# Patient Record
Sex: Female | Born: 1990 | Race: White | Hispanic: No | Marital: Single | Smoking: Never smoker
Health system: Southern US, Community
[De-identification: ages and names within clinical notes are randomized; demographics above are authoritative.]

## PROBLEM LIST (undated history)

## (undated) DIAGNOSIS — Z789 Other specified health status: Secondary | ICD-10-CM

## (undated) DIAGNOSIS — F32A Depression, unspecified: Secondary | ICD-10-CM

## (undated) DIAGNOSIS — F419 Anxiety disorder, unspecified: Secondary | ICD-10-CM

## (undated) DIAGNOSIS — F329 Major depressive disorder, single episode, unspecified: Secondary | ICD-10-CM

## (undated) HISTORY — DX: Depression, unspecified: F32.A

## (undated) HISTORY — PX: NO PAST SURGERIES: SHX2092

## (undated) HISTORY — PX: TONSILLECTOMY: SUR1361

## (undated) HISTORY — DX: Anxiety disorder, unspecified: F41.9

---
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
## 2009-06-15 ENCOUNTER — Inpatient Hospital Stay (HOSPITAL_COMMUNITY): Admission: AD | Admit: 2009-06-15 | Discharge: 2009-06-15 | Payer: Self-pay | Admitting: Obstetrics & Gynecology

## 2009-09-02 ENCOUNTER — Inpatient Hospital Stay (HOSPITAL_COMMUNITY): Admission: AD | Admit: 2009-09-02 | Discharge: 2009-09-02 | Payer: Self-pay | Admitting: Obstetrics & Gynecology

## 2009-09-03 ENCOUNTER — Inpatient Hospital Stay (HOSPITAL_COMMUNITY): Admission: AD | Admit: 2009-09-03 | Discharge: 2009-09-05 | Payer: Self-pay | Admitting: Obstetrics & Gynecology

## 2009-09-03 ENCOUNTER — Ambulatory Visit: Payer: Self-pay | Admitting: Obstetrics and Gynecology

## 2010-07-24 LAB — CBC
HCT: 38.9 % (ref 36.0–46.0)
Platelets: 195 10*3/uL (ref 150–400)
RDW: 13.5 % (ref 11.5–15.5)

## 2011-05-15 ENCOUNTER — Other Ambulatory Visit: Payer: Self-pay | Admitting: Physician Assistant

## 2011-05-15 DIAGNOSIS — IMO0002 Reserved for concepts with insufficient information to code with codable children: Secondary | ICD-10-CM

## 2011-05-20 ENCOUNTER — Ambulatory Visit (HOSPITAL_COMMUNITY): Admission: RE | Admit: 2011-05-20 | Payer: No Typology Code available for payment source | Source: Ambulatory Visit

## 2011-05-23 ENCOUNTER — Encounter (HOSPITAL_COMMUNITY): Payer: Self-pay

## 2011-05-23 ENCOUNTER — Emergency Department (HOSPITAL_COMMUNITY)
Admission: EM | Admit: 2011-05-23 | Discharge: 2011-05-24 | Disposition: A | Discharge status: Home / Self Care | Payer: No Typology Code available for payment source | Attending: Emergency Medicine | Admitting: Emergency Medicine

## 2011-05-23 DIAGNOSIS — O99891 Other specified diseases and conditions complicating pregnancy: Secondary | ICD-10-CM | POA: Insufficient documentation

## 2011-05-23 DIAGNOSIS — Z79899 Other long term (current) drug therapy: Secondary | ICD-10-CM | POA: Insufficient documentation

## 2011-05-23 DIAGNOSIS — M25579 Pain in unspecified ankle and joints of unspecified foot: Secondary | ICD-10-CM | POA: Insufficient documentation

## 2011-05-23 DIAGNOSIS — M549 Dorsalgia, unspecified: Secondary | ICD-10-CM | POA: Insufficient documentation

## 2011-05-23 DIAGNOSIS — M79609 Pain in unspecified limb: Secondary | ICD-10-CM | POA: Insufficient documentation

## 2011-05-23 DIAGNOSIS — M25571 Pain in right ankle and joints of right foot: Secondary | ICD-10-CM

## 2011-05-23 DIAGNOSIS — Z3400 Encounter for supervision of normal first pregnancy, unspecified trimester: Secondary | ICD-10-CM

## 2011-05-23 DIAGNOSIS — M79603 Pain in arm, unspecified: Secondary | ICD-10-CM

## 2011-05-23 HISTORY — DX: Other specified health status: Z78.9

## 2011-05-23 LAB — PROTIME-INR
INR: 0.93 (ref 0.00–1.49)
Prothrombin Time: 12.7 seconds (ref 11.6–15.2)

## 2011-05-23 LAB — CBC
MCH: 31.2 pg (ref 26.0–34.0)
MCHC: 34 g/dL (ref 30.0–36.0)
Platelets: 207 10*3/uL (ref 150–400)
RBC: 3.94 MIL/uL (ref 3.87–5.11)

## 2011-05-23 LAB — DIFFERENTIAL
Basophils Relative: 0 % (ref 0–1)
Eosinophils Absolute: 0.1 10*3/uL (ref 0.0–0.7)
Lymphs Abs: 2.2 10*3/uL (ref 0.7–4.0)
Neutrophils Relative %: 67 % (ref 43–77)

## 2011-05-23 LAB — ABO/RH: ABO/RH(D): O POS

## 2011-05-23 MED ORDER — HYDROCODONE-ACETAMINOPHEN 5-325 MG PO TABS
1.0000 | ORAL_TABLET | Freq: Once | ORAL | Status: AC
  Administered 2011-05-23: 1 via ORAL
  Filled 2011-05-23: qty 1
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
## 2011-05-23 NOTE — ED Notes (Signed)
Received report from stretcher triage RN, Lanora Manis. Patient currently being monitored by [facility] RN. Ambulatory to Bathroom with no assist. A&O x 3. resp e/u, nad. Will continue to monitor.
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
## 2011-05-23 NOTE — ED Notes (Signed)
Family at bedside. 

## 2011-05-23 NOTE — ED Notes (Signed)
[facility] RN at bedside with fetal monitoring in place. Pt has no signs of distress.
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
## 2011-05-23 NOTE — ED Notes (Signed)
Pt getting undressed fully and into gown.

## 2011-05-24 MED ORDER — HYDROCODONE-ACETAMINOPHEN 5-325 MG PO TABS: 1.0000 | ORAL_TABLET | ORAL | Status: AC | PRN

## 2011-05-24 MED ORDER — HYDROCODONE-ACETAMINOPHEN 5-325 MG PO TABS
1.0000 | ORAL_TABLET | Freq: Once | ORAL | Status: AC
  Administered 2011-05-24: 1 via ORAL
  Filled 2011-05-24: qty 1

## 2011-05-27 NOTE — ED Provider Notes (Signed)
Medical screening examination/treatment/procedure(s) were performed by non-physician practitioner and as supervising physician I was immediately available for consultation/collaboration.  Loren Racer, MD 05/27/11 [phone]

## 2011-06-20 ENCOUNTER — Encounter (HOSPITAL_COMMUNITY): Payer: Self-pay | Admitting: *Deleted

## 2013-04-06 ENCOUNTER — Emergency Department (HOSPITAL_COMMUNITY)
Admission: EM | Admit: 2013-04-06 | Discharge: 2013-04-06 | Disposition: A | Discharge status: Home / Self Care | Payer: No Typology Code available for payment source | Attending: Emergency Medicine | Admitting: Emergency Medicine

## 2013-04-06 ENCOUNTER — Encounter (HOSPITAL_COMMUNITY): Payer: Self-pay | Admitting: Emergency Medicine

## 2013-04-06 DIAGNOSIS — L0231 Cutaneous abscess of buttock: Secondary | ICD-10-CM | POA: Insufficient documentation

## 2013-04-06 DIAGNOSIS — Z79899 Other long term (current) drug therapy: Secondary | ICD-10-CM | POA: Insufficient documentation

## 2013-04-06 MED ORDER — HYDROMORPHONE HCL PF 1 MG/ML IJ SOLN
1.0000 mg | Freq: Once | INTRAMUSCULAR | Status: AC
  Administered 2013-04-06: 1 mg via INTRAMUSCULAR
  Filled 2013-04-06: qty 1

## 2013-04-06 NOTE — Progress Notes (Signed)
[facility] provided pt with a list of primary care resources.  °

## 2013-04-06 NOTE — ED Provider Notes (Signed)
CSN: [id number]     Arrival date & time 04/06/13  1446 History   First MD Initiated Contact with Patient 04/06/13 1519   This chart was scribed for non-physician practitioner Francee Piccolo, PA-C working with Roney Marion, MD by Valera Castle, ED scribe. This patient was seen in room WTR5/WTR5 and the patient's care was started at 3:31 PM.   Chief Complaint  Patient presents with  . Abscess    r/lower buttock abscess, started 1 week ago. I and D in ED 3 days ago    The history is provided by the patient. No language interpreter was used.   HPI Comments: Megan Wiley is a 22 y.o. female who presents to the Emergency Department complaining of a sudden, moderate, worsening abscess, onset 1 week ago. She reports first thinking it was just a pimple, but states over the past 5 days the swelling has increased. She reports trying to take hot baths with salt, with little relief. She reports waking up 3 days ago with severe swelling and black discoloration around the head of the abscess. She reports going to the [facility] ED then, and having I&D performed. She states she went for a check up 2 days ago at the same ED, but reports being unhappy with the doctors there due to contradictions with her care. She denies receiving any pain medication for her symptoms while at the ED. She states the doctor told her to take 3 baths a day to aid drainage, which she has been doing, but states when she got out of the tub today, she experienced even worse pain and states she can barely ambulate. She reports taking antibiotics she was prescribed yesterday and this morning. She denies having any complications with bowel movements, fever, and any other associated symptoms. Pt has no pertinent medical history.  PCP - No primary provider on file.  Past Medical History  Diagnosis Date  . No pertinent past medical history    Past Surgical History  Procedure Laterality Date  . No past surgeries    . Tonsillectomy    .  Cesarean section     Family History  Problem Relation Age of Onset  . Diabetes Other   . Hypertension Other    History  Substance Use Topics  . Smoking status: Never Smoker   . Smokeless tobacco: Not on file  . Alcohol Use: Yes   OB History   Grav Para Term Preterm Abortions TAB SAB Ect Mult Living   2 1 1  0 0 0 0 0 0 1     Review of Systems  Constitutional: Negative for fever.  Gastrointestinal: Negative for diarrhea and constipation.  Skin: Positive for wound (abscess to right lower buttocks).  All other systems reviewed and are negative.   Allergies  Review of patient's allergies indicates no known allergies.  Home Medications   Current Outpatient Rx  Name  Route  Sig  Dispense  Refill  . ALPRAZolam (XANAX) 0.5 MG tablet   Oral   Take 0.5 mg by mouth at bedtime as needed for anxiety.         . citalopram (CELEXA) 40 MG tablet   Oral   Take 40 mg by mouth daily.         Marland Kitchen HYDROcodone-acetaminophen (NORCO/VICODIN) 5-325 MG per tablet   Oral   Take 1 tablet by mouth every 6 (six) hours as needed for moderate pain.         Marland Kitchen sulfamethoxazole-trimethoprim (BACTRIM DS) 800-160 MG  per tablet   Oral   Take 1 tablet by mouth 2 (two) times daily.          BP 122/74  Pulse 95  Temp(Src) 98.4 F (36.9 C) (Oral)  Resp 18  Wt 130 lb (58.968 kg)  SpO2 96%  LMP 03/30/2013  Breastfeeding? Unknown  Physical Exam  Constitutional: She is oriented to person, place, and time. She appears well-developed and well-nourished. No distress.  HENT:  Head: Normocephalic and atraumatic.  Right Ear: External ear normal.  Left Ear: External ear normal.  Nose: Nose normal.  Mouth/Throat: Oropharynx is clear and moist.  Eyes: Conjunctivae are normal.  Neck: Normal range of motion. Neck supple.  Cardiovascular: Normal rate.   Pulmonary/Chest: Effort normal.  Abdominal: Soft.  Musculoskeletal: Normal range of motion.  Neurological: She is alert and oriented to person,  place, and time.  Skin: Skin is warm and dry. She is not diaphoretic.     Area of abscess marked on graphical documentation. 1.5 cm circular opened abscess with no purulent drainage. Early stages of wound healing noted. Tender to palpation. Mildly erythematous around.  Psychiatric: She has a normal mood and affect.    ED Course  Procedures (including critical care time)  DIAGNOSTIC STUDIES: Oxygen Saturation is 96% on room air, normal by my interpretation.    COORDINATION OF CARE: 3:41 PM-Discussed treatment plan which includes wound care with pt at bedside and pt agreed to plan.   Labs Review Labs Reviewed - No data to display Imaging Review No results found.  EKG Interpretation   None      INCISION AND DRAINAGE Performed by: Francee Piccolo L Consent: Verbal consent obtained. Risks and benefits: risks, benefits and alternatives were discussed Type: abscess  Body area: right buttock cheek   Anesthesia: local infiltration  Incision was made with a scalpel.  Local anesthetic: lidocaine 2% w/ epinephrine  Anesthetic total: 6 ml  Complexity: complex Blunt dissection to break up loculations  Drainage: purulent  Drainage amount: copious  Packing material: 1/4 in iodoform gauze  Patient tolerance: Patient tolerated the procedure well with no immediate complications.     MDM   1. Abscess of right buttock     Patient with skin abscess amenable to incision and drainage.  Abscess was drained and packed,  wound recheck in 2 days. Encouraged home warm soaks and flushing.  Mild signs of cellulitis is surrounding skin.  Will d/c to home.  Advised to continue antibiotic use.   Patient d/w with Dr. Fayrene Fearing, agrees with plan.    I personally performed the services described in this documentation, which was scribed in my presence. The recorded information has been reviewed and is accurate.     Jeannetta Ellis, PA-C 04/06/13 1858

## 2013-04-06 NOTE — ED Notes (Signed)
Pt stated that she was seen in the ED in [facility] 3 days ago for c/o swelling and possible abscess r/buttock.  I and D with packing completed. Antibiotic initiated. Packing removed 2 days ago.  Currently:1.5 cm opening is draining moderated amount of purulent drainage with small amount of tissue extending from opening. Area around wound very red and painful. 2 cm halo of inflamed tissue around wound

## 2013-04-06 NOTE — ED Provider Notes (Signed)
Medical screening examination/treatment/procedure(s) were performed by non-physician practitioner and as supervising physician I was immediately available for consultation/collaboration.  EKG Interpretation   None         Tanganyika Bowlds J Roni Scow, MD 04/06/13 2247 

## 2013-04-08 ENCOUNTER — Emergency Department (HOSPITAL_COMMUNITY)
Admission: EM | Admit: 2013-04-08 | Discharge: 2013-04-08 | Disposition: A | Discharge status: Home / Self Care | Payer: No Typology Code available for payment source | Attending: Emergency Medicine | Admitting: Emergency Medicine

## 2013-04-08 ENCOUNTER — Encounter (HOSPITAL_COMMUNITY): Payer: Self-pay | Admitting: Emergency Medicine

## 2013-04-08 DIAGNOSIS — Z5189 Encounter for other specified aftercare: Secondary | ICD-10-CM

## 2013-04-08 DIAGNOSIS — Z79899 Other long term (current) drug therapy: Secondary | ICD-10-CM | POA: Insufficient documentation

## 2013-04-08 DIAGNOSIS — Z4801 Encounter for change or removal of surgical wound dressing: Secondary | ICD-10-CM | POA: Insufficient documentation

## 2013-04-08 MED ORDER — HYDROCODONE-ACETAMINOPHEN 5-325 MG PO TABS: 1.0000 | ORAL_TABLET | ORAL | Status: DC | PRN

## 2013-04-08 MED ORDER — HYDROCODONE-ACETAMINOPHEN 5-325 MG PO TABS
2.0000 | ORAL_TABLET | Freq: Once | ORAL | Status: AC
  Administered 2013-04-08: 2 via ORAL
  Filled 2013-04-08: qty 2

## 2013-04-08 MED ORDER — IBUPROFEN 600 MG PO TABS: 600.0000 mg | ORAL_TABLET | Freq: Four times a day (QID) | ORAL | Status: DC | PRN

## 2013-04-08 NOTE — ED Notes (Signed)
Pt here to have packing removed; pt had an abscess drained earlier this week and had packing placed and is now here to have it removed. Abscess is right under her right buttocks on her upper thigh.

## 2013-04-08 NOTE — ED Provider Notes (Signed)
CSN: [id number]     Arrival date & time 04/08/13  1538 History  This chart was scribed for non-physician practitioner working with Junius Argyle, MD by Ashley Jacobs, ED scribe. This patient was seen in room WTR9/WTR9 and the patient's care was started at 5:27 PM. First MD Initiated Contact with Patient 04/08/13 1654     Chief Complaint  Patient presents with  . Wound Check   (Consider location/radiation/quality/duration/timing/severity/associated sxs/prior Treatment) The history is provided by the patient and medical records. No language interpreter was used.   HPI Comments: Megan Wiley is a 22 y.o. female who presents to the Emergency Department for a wound check for an abscess to her right lower buttock that was treated two days ago. Pt is taking Bactrim  DS 800-160 mg once a day. The site is painful and 8/10 in severity. Pt states it feels like it is getting better but it continues to drain. She recently ran out of pain medication but she is taking BC Goody's powder. She does not smoke or drink alcohol.    Past Medical History  Diagnosis Date  . No pertinent past medical history    Past Surgical History  Procedure Laterality Date  . No past surgeries    . Tonsillectomy    . Cesarean section     Family History  Problem Relation Age of Onset  . Diabetes Other   . Hypertension Other    History  Substance Use Topics  . Smoking status: Never Smoker   . Smokeless tobacco: Not on file  . Alcohol Use: Yes   OB History   Grav Para Term Preterm Abortions TAB SAB Ect Mult Living   2 1 1  0 0 0 0 0 0 1     Review of Systems  Skin: Positive for wound.    Allergies  Review of patient's allergies indicates no known allergies.  Home Medications   Current Outpatient Rx  Name  Route  Sig  Dispense  Refill  . ALPRAZolam (XANAX) 0.5 MG tablet   Oral   Take 0.5 mg by mouth at bedtime as needed for anxiety.         . Aspirin-Salicylamide-Caffeine (BC HEADACHE POWDER  PO)   Oral   Take 2 each by mouth every 4 (four) hours as needed (pain).         . citalopram (CELEXA) 40 MG tablet   Oral   Take 40 mg by mouth daily.         Marland Kitchen HYDROcodone-acetaminophen (NORCO/VICODIN) 5-325 MG per tablet   Oral   Take 1 tablet by mouth every 6 (six) hours as needed for moderate pain.         Marland Kitchen sulfamethoxazole-trimethoprim (BACTRIM DS) 800-160 MG per tablet   Oral   Take 1 tablet by mouth 2 (two) times daily.         Marland Kitchen HYDROcodone-acetaminophen (NORCO/VICODIN) 5-325 MG per tablet   Oral   Take 1-2 tablets by mouth every 4 (four) hours as needed.   10 tablet   0   . ibuprofen (ADVIL,MOTRIN) 600 MG tablet   Oral   Take 1 tablet (600 mg total) by mouth every 6 (six) hours as needed.   30 tablet   0    BP 131/69  Pulse 98  Temp(Src) 98 F (36.7 C) (Oral)  Resp 16  Wt 130 lb (58.968 kg)  SpO2 99%  LMP 03/30/2013 Physical Exam  Nursing note and vitals reviewed. Constitutional: She is oriented  to person, place, and time. She appears well-developed and well-nourished.  HENT:  Head: Normocephalic and atraumatic.  Eyes: EOM are normal.  Neck: Normal range of motion.  Cardiovascular: Normal rate.   Pulmonary/Chest: Effort normal.  Musculoskeletal: Normal range of motion.  Neurological: She is alert and oriented to person, place, and time.  Skin: Skin is warm and dry.  0.5 cm x 1 cm draining abscess  purulent drainage Tender to palpation 0.5 cm surround induration and errythema  Psychiatric: She has a normal mood and affect. Her behavior is normal.    ED Course  Procedures The wound is cleansed, debrided of foreign material as much as possible, and dressed. Pt was advised area will continue to drain and it is important for her to complete her entire dose of Bactrim, may f/u with her PCP in 3 days for wound recheck.  Abscess did not need to be repacked today. Home wound care instructions are provided. Return precautions provided. Pt verbalized  understanding and agreement with tx plan.   DIAGNOSTIC STUDIES: Oxygen Saturation is 99% on room air, normal by my interpretation.    COORDINATION OF CARE: 5:29 PM Discussed course of care with pt . Pt understands and agrees.  Labs Review Labs Reviewed - No data to display Imaging Review No results found.  EKG Interpretation   None       MDM   1. Wound check, abscess    I personally performed the services described in this documentation, which was scribed in my presence. The recorded information has been reviewed and is accurate.     Junius Finner, PA-C 04/09/13 1112

## 2013-04-08 NOTE — ED Notes (Signed)
Pt alert, nad, arrives from home, c/o needing packing removed, seen in ED12/2, resp even unlabored, skin pwd

## 2013-04-09 NOTE — ED Provider Notes (Signed)
Medical screening examination/treatment/procedure(s) were performed by non-physician practitioner and as supervising physician I was immediately available for consultation/collaboration.  EKG Interpretation   None         Seiya Silsby S Keidra Withers, MD 04/09/13 1400 

## 2013-06-21 ENCOUNTER — Encounter (HOSPITAL_COMMUNITY): Payer: Self-pay | Admitting: Emergency Medicine

## 2013-06-21 ENCOUNTER — Emergency Department (HOSPITAL_COMMUNITY)
Admission: EM | Admit: 2013-06-21 | Discharge: 2013-06-21 | Disposition: A | Discharge status: Home / Self Care | Payer: Self-pay | Attending: Emergency Medicine | Admitting: Emergency Medicine

## 2013-06-21 ENCOUNTER — Emergency Department (HOSPITAL_COMMUNITY): Payer: Medicaid Other

## 2013-06-21 DIAGNOSIS — Y9389 Activity, other specified: Secondary | ICD-10-CM | POA: Insufficient documentation

## 2013-06-21 DIAGNOSIS — R42 Dizziness and giddiness: Secondary | ICD-10-CM | POA: Insufficient documentation

## 2013-06-21 DIAGNOSIS — S0180XA Unspecified open wound of other part of head, initial encounter: Secondary | ICD-10-CM | POA: Insufficient documentation

## 2013-06-21 DIAGNOSIS — S058X9A Other injuries of unspecified eye and orbit, initial encounter: Secondary | ICD-10-CM | POA: Insufficient documentation

## 2013-06-21 DIAGNOSIS — S0181XA Laceration without foreign body of other part of head, initial encounter: Secondary | ICD-10-CM

## 2013-06-21 DIAGNOSIS — Y929 Unspecified place or not applicable: Secondary | ICD-10-CM | POA: Insufficient documentation

## 2013-06-21 DIAGNOSIS — H05232 Hemorrhage of left orbit: Secondary | ICD-10-CM

## 2013-06-21 DIAGNOSIS — Z79899 Other long term (current) drug therapy: Secondary | ICD-10-CM | POA: Insufficient documentation

## 2013-06-21 DIAGNOSIS — S0990XA Unspecified injury of head, initial encounter: Secondary | ICD-10-CM | POA: Insufficient documentation

## 2013-06-21 DIAGNOSIS — S0510XA Contusion of eyeball and orbital tissues, unspecified eye, initial encounter: Secondary | ICD-10-CM | POA: Insufficient documentation

## 2013-06-21 MED ORDER — IBUPROFEN 600 MG PO TABS: 600.0000 mg | ORAL_TABLET | Freq: Four times a day (QID) | ORAL | Status: DC | PRN

## 2013-06-21 MED ORDER — OXYCODONE-ACETAMINOPHEN 5-325 MG PO TABS
1.0000 | ORAL_TABLET | Freq: Once | ORAL | Status: AC
  Administered 2013-06-21: 1 via ORAL
  Filled 2013-06-21: qty 1

## 2013-06-21 MED ORDER — HYDROCODONE-ACETAMINOPHEN 5-325 MG PO TABS: 1.0000 | ORAL_TABLET | ORAL | Status: DC | PRN
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
## 2013-06-21 NOTE — Discharge Instructions (Signed)
Continue to ice your face at home. Bacitracin topically twice a day. Ibuprofen and Norco for pain. Followup with your physician for suture mobile 5-7 days.  Facial Laceration  A facial laceration is a cut on the face. These injuries can be painful and cause bleeding. Lacerations usually heal quickly, but they need special care to reduce scarring. DIAGNOSIS  Your health care provider will take a medical history, ask for details about how the injury occurred, and examine the wound to determine how deep the cut is. TREATMENT  Some facial lacerations may not require closure. Others may not be able to be closed because of an increased risk of infection. The risk of infection and the chance for successful closure will depend on various factors, including the amount of time since the injury occurred. The wound may be cleaned to help prevent infection. If closure is appropriate, pain medicines may be given if needed. Your health care provider will use stitches (sutures), wound glue (adhesive), or skin adhesive strips to repair the laceration. These tools bring the skin edges together to allow for faster healing and a better cosmetic outcome. If needed, you may also be given a tetanus shot. HOME CARE INSTRUCTIONS  Only take over-the-counter or prescription medicines as directed by your health care provider.  Follow your health care provider's instructions for wound care. These instructions will vary depending on the technique used for closing the wound. For Sutures:  Keep the wound clean and dry.   If you were given a bandage (dressing), you should change it at least once a day. Also change the dressing if it becomes wet or dirty, or as directed by your health care provider.   Wash the wound with soap and water 2 times a day. Rinse the wound off with water to remove all soap. Pat the wound dry with a clean towel.   After cleaning, apply a thin layer of the antibiotic ointment recommended by your  health care provider. This will help prevent infection and keep the dressing from sticking.   You may shower as usual after the first 24 hours. Do not soak the wound in water until the sutures are removed.   Get your sutures removed as directed by your health care provider. With facial lacerations, sutures should usually be taken out after 4 5 days to avoid stitch marks.   Wait a few days after your sutures are removed before applying any makeup. For Skin Adhesive Strips:  Keep the wound clean and dry.   Do not get the skin adhesive strips wet. You may bathe carefully, using caution to keep the wound dry.   If the wound gets wet, pat it dry with a clean towel.   Skin adhesive strips will fall off on their own. You may trim the strips as the wound heals. Do not remove skin adhesive strips that are still stuck to the wound. They will fall off in time.  For Wound Adhesive:  You may briefly wet your wound in the shower or bath. Do not soak or scrub the wound. Do not swim. Avoid periods of heavy sweating until the skin adhesive has fallen off on its own. After showering or bathing, gently pat the wound dry with a clean towel.   Do not apply liquid medicine, cream medicine, ointment medicine, or makeup to your wound while the skin adhesive is in place. This may loosen the film before your wound is healed.   If a dressing is placed over the wound,  be careful not to apply tape directly over the skin adhesive. This may cause the adhesive to be pulled off before the wound is healed.   Avoid prolonged exposure to sunlight or tanning lamps while the skin adhesive is in place.  The skin adhesive will usually remain in place for 5 10 days, then naturally fall off the skin. Do not pick at the adhesive film.  After Healing: Once the wound has healed, cover the wound with sunscreen during the day for 1 full year. This can help minimize scarring. Exposure to ultraviolet light in the first year  will darken the scar. It can take 1 2 years for the scar to lose its redness and to heal completely.  SEEK IMMEDIATE MEDICAL CARE IF:  You have redness, pain, or swelling around the wound.   You see ayellowish-white fluid (pus) coming from the wound.   You have chills or a fever.  MAKE SURE YOU:  Understand these instructions.  Will watch your condition.  Will get help right away if you are not doing well or get worse. Document Released: 05/30/2004 Document Revised: 02/10/2013 Document Reviewed: 12/03/2012 [facility] Patient Information 2014 [place], [place].

## 2013-06-21 NOTE — ED Notes (Signed)
[employer] at bedside taking report  

## 2013-06-22 NOTE — ED Provider Notes (Signed)
Medical screening examination/treatment/procedure(s) were performed by non-physician practitioner and as supervising physician I was immediately available for consultation/collaboration.  EKG Interpretation   None        Jisela Merlino, MD 06/22/13 1325 

## 2014-03-07 ENCOUNTER — Encounter (HOSPITAL_COMMUNITY): Payer: Self-pay | Admitting: Emergency Medicine
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
## 2015-10-09 ENCOUNTER — Emergency Department (HOSPITAL_COMMUNITY): Payer: No Typology Code available for payment source

## 2015-10-09 ENCOUNTER — Emergency Department (HOSPITAL_COMMUNITY)
Admission: EM | Admit: 2015-10-09 | Discharge: 2015-10-09 | Disposition: A | Discharge status: Home / Self Care | Payer: No Typology Code available for payment source | Attending: Emergency Medicine | Admitting: Emergency Medicine

## 2015-10-09 DIAGNOSIS — Z79899 Other long term (current) drug therapy: Secondary | ICD-10-CM | POA: Insufficient documentation

## 2015-10-09 DIAGNOSIS — Y92219 Unspecified school as the place of occurrence of the external cause: Secondary | ICD-10-CM | POA: Insufficient documentation

## 2015-10-09 DIAGNOSIS — Z7982 Long term (current) use of aspirin: Secondary | ICD-10-CM | POA: Insufficient documentation

## 2015-10-09 DIAGNOSIS — Y999 Unspecified external cause status: Secondary | ICD-10-CM | POA: Insufficient documentation

## 2015-10-09 DIAGNOSIS — Z791 Long term (current) use of non-steroidal anti-inflammatories (NSAID): Secondary | ICD-10-CM | POA: Insufficient documentation

## 2015-10-09 DIAGNOSIS — S93402A Sprain of unspecified ligament of left ankle, initial encounter: Secondary | ICD-10-CM

## 2015-10-09 DIAGNOSIS — W1849XA Other slipping, tripping and stumbling without falling, initial encounter: Secondary | ICD-10-CM | POA: Insufficient documentation

## 2015-10-09 DIAGNOSIS — Y939 Activity, unspecified: Secondary | ICD-10-CM | POA: Insufficient documentation

## 2015-10-09 MED ORDER — ACETAMINOPHEN 500 MG PO TABS
1000.0000 mg | ORAL_TABLET | Freq: Once | ORAL | Status: AC
  Administered 2015-10-09: 1000 mg via ORAL
  Filled 2015-10-09: qty 2

## 2015-10-09 MED ORDER — IBUPROFEN 200 MG PO TABS
400.0000 mg | ORAL_TABLET | Freq: Once | ORAL | Status: AC
  Administered 2015-10-09: 400 mg via ORAL
  Filled 2015-10-09: qty 2

## 2015-10-09 NOTE — ED Provider Notes (Signed)
CSN: [id number]     Arrival date & time 10/09/15  1010 History   First MD Initiated Contact with Patient 10/09/15 1130     Chief Complaint  Patient presents with  . Ankle Pain   (Consider location/radiation/quality/duration/timing/severity/associated sxs/prior Treatment) HPI 25 y.o. femalepresents to the Emergency Department today complaining of left ankle pain s/p rolling ankle last Monday while palying softball. Pt states that she did RICE therapy on the ankle with improvement of symptoms. Noted this morning dropping her kids off at school and slipping on the stairs due to the rain. States pain returned with swelling and bruising noted. States pain is 6/10 and throbbing. Pt able to ambulate with minor discomfort. No other symptoms noted    Past Medical History  Diagnosis Date  . No pertinent past medical history    Past Surgical History  Procedure Laterality Date  . No past surgeries    . Tonsillectomy    . Cesarean section     Family History  Problem Relation Age of Onset  . Diabetes Other   . Hypertension Other    Social History  Substance Use Topics  . Smoking status: Never Smoker   . Smokeless tobacco: Not on file  . Alcohol Use: Yes   OB History    Gravida Para Term Preterm AB TAB SAB Ectopic Multiple Living   0 0 0 0 0 0 1     Review of Systems  Gastrointestinal: Negative for nausea.  Musculoskeletal: Positive for joint swelling. Negative for gait problem.  Skin: Negative for wound.   Allergies  Review of patient's allergies indicates no known allergies.  Home Medications   Prior to Admission medications   Medication Sig Start Date End Date Taking? Authorizing Provider  ALPRAZolam Prudy Feeler) 0.5 MG tablet Take 0.5 mg by mouth at bedtime as needed for anxiety.    Historical Provider, MD  Aspirin-Salicylamide-Caffeine (BC HEADACHE POWDER PO) Take 2 each by mouth every 4 (four) hours as needed (pain).    Historical Provider, MD  citalopram (CELEXA) 40 MG  tablet Take 40 mg by mouth daily.    Historical Provider, MD  HYDROcodone-acetaminophen (NORCO/VICODIN) 5-325 MG per tablet Take 1 tablet by mouth every 4 (four) hours as needed. 06/21/13   Tatyana Kirichenko, PA-C  ibuprofen (ADVIL,MOTRIN) 600 MG tablet Take 1 tablet (600 mg total) by mouth every 6 (six) hours as needed. 06/21/13   Tatyana Kirichenko, PA-C   BP 131/81 mmHg  Pulse 83  Temp(Src) 98.8 F (37.1 C) (Oral)  Resp 16  SpO2 97%   Physical Exam  Constitutional: She is oriented to person, place, and time. She appears well-developed and well-nourished.  HENT:  Head: Normocephalic and atraumatic.  Eyes: EOM are normal. Pupils are equal, round, and reactive to light.  Neck: Normal range of motion. Neck supple.  Cardiovascular: Normal rate and regular rhythm.   Pulmonary/Chest: Effort normal.  Abdominal: Soft.  Musculoskeletal: Normal range of motion.       Left ankle: She exhibits swelling and ecchymosis. She exhibits normal range of motion, no deformity and no laceration. Tenderness. Lateral malleolus tenderness found. Achilles tendon normal.  Left ankle neurovascularly intact. Full ROM without difficulty. Slight ecchymosis noted inferior to lateral malleolus.   Neurological: She is alert and oriented to person, place, and time.  Skin: Skin is warm and dry.  Psychiatric: She has a normal mood and affect. Her behavior is normal. Thought content normal.  Nursing note and vitals reviewed.  ED Course  Procedures (including critical care time) Labs Review Labs Reviewed - No data to display  Imaging Review Dg Ankle Complete Left  10/09/2015  CLINICAL DATA:  Twisted ankle playing softball approximately 1 week ago, injury again this morning. Pain laterally. EXAM: LEFT ANKLE COMPLETE - 3+ VIEW COMPARISON:  None. FINDINGS: Osseous alignment is normal. Bone mineralization is normal. No fracture line or displaced fracture fragment seen. Ankle mortise is symmetric. Visualized osseous  structures of the hindfoot and midfoot appear intact and normally aligned. There is soft tissue swelling, most prominent laterally. Soft tissues about the left ankle are otherwise unremarkable. IMPRESSION: Soft tissue swelling.  No osseous fracture or dislocation. Electronically Signed   By: Bary RichardStan  Maynard M.D.   On: 10/09/2015 11:03   I have personally reviewed and evaluated these images and lab results as part of my medical decision-making.   EKG Interpretation None      MDM  I have reviewed and evaluated the relevant imaging studies.  I have reviewed the relevant previous healthcare records.I obtained HPI from historian.  ED Course:  Assessment: Pt is a 24yF who presents with left ankle pain s/p twisting ankle x 1 week ago. Re-rolled ankle today. On exam, pt in NAD. Nontoxic/nonseptic appearing. VSS. Afebrile. Left ankle with minimal swelling on lateral malleolus. ROM intact. Neurovascularly intact. Imaging unremarkable for acute fracture. Given ankle aso in ED. Plan is to DC home with follow up to PCP. At time of discharge, Patient is in no acute distress. Vital Signs are stable. Patient is able to ambulate. Patient able to tolerate PO.    Disposition/Plan:  DC Home Additional Verbal discharge instructions given and discussed with patient.  Pt Instructed to f/u with PCP in the next week for evaluation and treatment of symptoms. Return precautions given Pt acknowledges and agrees with plan  Supervising Physician Melene Planan Floyd, DO   Final diagnoses:  Ankle sprain, left, initial encounter     Audry Piliyler Alinna Siple, PA-C 10/09/15 1150  Melene Planan Floyd, DO 10/09/15 1151
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
## 2015-10-09 NOTE — ED Notes (Signed)
Discharge instructions and follow up care reviewed with patient. Patient verbalized understanding. 

## 2015-10-09 NOTE — ED Notes (Signed)
PA at bedside.

## 2015-10-09 NOTE — Discharge Instructions (Signed)
Please read and follow all provided instructions.  Your diagnoses today include:  1. Ankle sprain, left, initial encounter    Tests performed today include:  Vital signs. See below for your results today.   Medications prescribed:   Take as prescribed   You can use Ibuprofen 400mg  combined with Tylenol 1000mg  for pain relief every 6 hours. Do not exceed 4g of Tylenol in one 24 hour period. Do not exceed 10 days of this treatment    Home care instructions:  Follow any educational materials contained in this packet.  Follow-up instructions: Please follow-up with your primary care provider for further evaluation of symptoms and treatment   Return instructions:   Please return to the Emergency Department if you do not get better, if you get worse, or new symptoms OR  - Fever (temperature greater than 101.51F)  - Bleeding that does not stop with holding pressure to the area    -Severe pain (please note that you may be more sore the day after your accident)  - Chest Pain  - Difficulty breathing  - Severe nausea or vomiting  - Inability to tolerate food and liquids  - Passing out  - Skin becoming red around your wounds  - Change in mental status (confusion or lethargy)  - New numbness or weakness     Please return if you have any other emergent concerns.  Additional Information:  Your vital signs today were: BP 131/81 mmHg   Pulse 83   Temp(Src) 98.8 F (37.1 C) (Oral)   Resp 16   SpO2 97% If your blood pressure (BP) was elevated above 135/85 this visit, please have this repeated by your doctor within one month. ---------------

## 2015-10-09 NOTE — ED Notes (Signed)
Pt c/o left ankle pain onset last Monday after twisting ankle playing softball, pt had been performing RICE for relief, this morning pt slipped and re-injured ankle. Motor function and sensation intact.

## 2016-08-27 ENCOUNTER — Ambulatory Visit (INDEPENDENT_AMBULATORY_CARE_PROVIDER_SITE_OTHER): Payer: Medicaid Other | Admitting: Physician Assistant

## 2016-08-27 ENCOUNTER — Encounter: Payer: Self-pay | Admitting: Physician Assistant

## 2016-08-27 VITALS — BP 116/77 | HR 75 | Temp 98.9°F | Ht 63.0 in | Wt 166.0 lb

## 2016-08-27 DIAGNOSIS — F419 Anxiety disorder, unspecified: Secondary | ICD-10-CM | POA: Diagnosis not present

## 2016-08-27 DIAGNOSIS — F339 Major depressive disorder, recurrent, unspecified: Secondary | ICD-10-CM

## 2016-08-27 MED ORDER — ALPRAZOLAM 1 MG PO TABS: 1.0000 mg | ORAL_TABLET | Freq: Every day | ORAL | 0 refills | Status: DC | PRN

## 2016-08-27 MED ORDER — CITALOPRAM HYDROBROMIDE 40 MG PO TABS: 40.0000 mg | ORAL_TABLET | Freq: Every day | ORAL | 2 refills | Status: DC

## 2016-08-27 NOTE — Patient Instructions (Signed)

## 2016-08-28 NOTE — Progress Notes (Signed)
BP 116/77   Pulse 75   Temp 98.9 F (37.2 C) (Oral)   Ht  (1.6 m)   Wt 166 lb (75.3 kg)   BMI 29.41 kg/m    Subjective:    Patient ID: Megan Wiley, female    DOB: [birth year], 26 y.o.   MRN: [id number]  HPI: Megan Wiley is a 26 y.o. female presenting on 08/27/2016 for Anxiety and Depression  This patient comes in for periodic recheck on medications and conditions including depression and anxiety.  Depression screen [facility] 2/9 08/27/2016  Decreased Interest 2  Down, Depressed, Hopeless 3  PHQ - 2 Score 5  Altered sleeping 2  Tired, decreased energy 2  Change in appetite 3  Feeling bad or failure about yourself  3  Trouble concentrating 3  Moving slowly or fidgety/restless 2  Suicidal thoughts 0  PHQ-9 Score 20    All medications are reviewed today. There are no reports of any problems with the medications. All of the medical conditions are reviewed and updated.  Lab work is reviewed and will be ordered as medically necessary. There are no new problems reported with today's visit.   Past Medical History:  Diagnosis Date  . No pertinent past medical history    Relevant past medical, surgical, family and social history reviewed and updated as indicated. Interim medical history since our last visit reviewed. Allergies and medications reviewed and updated. DATA REVIEWED: CHART IN EPIC  Social History   Social History  . Marital status: Single    Spouse name: N/A  . Number of children: N/A  . Years of education: N/A   Occupational History  . Not on file.   Social History Main Topics  . Smoking status: Never Smoker  . Smokeless tobacco: Never Used  . Alcohol use Yes     Comment: occ  . Drug use: No  . Sexual activity: Yes    Birth control/ protection: Implant   Other Topics Concern  . Not on file   Social History Narrative  . No narrative on file    Past Surgical History:  Procedure Laterality Date  . CESAREAN SECTION    . NO PAST SURGERIES    .  TONSILLECTOMY      Family History  Problem Relation Age of Onset  . Diabetes Other   . Hypertension Other   . Hepatitis C Father   . Cirrhosis Father     Review of Systems  Constitutional: Negative.  Negative for activity change, fatigue and fever.  HENT: Negative.   Eyes: Negative.   Respiratory: Negative.  Negative for cough.   Cardiovascular: Negative.  Negative for chest pain.  Gastrointestinal: Negative.  Negative for abdominal pain.  Endocrine: Negative.   Genitourinary: Negative.  Negative for dysuria.  Musculoskeletal: Negative.   Skin: Negative.   Neurological: Negative.     Allergies as of 08/27/2016   No Known Allergies     Medication List       Accurate as of 08/27/16 11:59 PM. Always use your most recent med list.          ALPRAZolam 1 MG tablet Commonly known as:  XANAX Take 1 tablet (1 mg total) by mouth daily as needed for anxiety.   citalopram 40 MG tablet Commonly known as:  CELEXA Take 1 tablet (40 mg total) by mouth daily.          Objective:    BP 116/77   Pulse 75   Temp 98.9 F (  37.2 C) (Oral)   Ht  (1.6 m)   Wt 166 lb (75.3 kg)   BMI 29.41 kg/m   No Known Allergies  Wt Readings from Last 3 Encounters:  08/27/16 166 lb (75.3 kg)  04/08/13 130 lb (59 kg)  04/06/13 130 lb (59 kg)    Physical Exam  Constitutional: She is oriented to person, place, and time. She appears well-developed and well-nourished.  HENT:  Head: Normocephalic and atraumatic.  Right Ear: Tympanic membrane, external ear and ear canal normal.  Left Ear: Tympanic membrane, external ear and ear canal normal.  Nose: Nose normal. No rhinorrhea.  Mouth/Throat: Oropharynx is clear and moist and mucous membranes are normal. No oropharyngeal exudate or posterior oropharyngeal erythema.  Eyes: Conjunctivae and EOM are normal. Pupils are equal, round, and reactive to light.  Neck: Normal range of motion. Neck supple.  Cardiovascular: Normal rate, regular  rhythm, normal heart sounds and intact distal pulses.   Pulmonary/Chest: Effort normal and breath sounds normal.  Abdominal: Soft. Bowel sounds are normal.  Neurological: She is alert and oriented to person, place, and time. She has normal reflexes.  Skin: Skin is warm and dry. No rash noted.  Psychiatric: She has a normal mood and affect. Her behavior is normal. Judgment and thought content normal.    Results for orders placed or performed during the hospital encounter of 05/23/11  CBC  Result Value Ref Range   WBC 11.7 (H) 4.0 - 10.5 K/uL   RBC 3.94 3.87 - 5.11 MIL/uL   Hemoglobin 12.3 12.0 - 15.0 g/dL   HCT 16.1 09.6 - 04.5 %   MCV 91.9 78.0 - 100.0 fL   MCH 31.2 26.0 - 34.0 pg   MCHC 34.0 30.0 - 36.0 g/dL   RDW 40.9 81.1 - 91.4 %   Platelets 207 150 - 400 K/uL  Differential  Result Value Ref Range   Neutrophils Relative % 67 43 - 77 %   Neutro Abs 7.9 (H) 1.7 - 7.7 K/uL   Lymphocytes Relative 19 12 - 46 %   Lymphs Abs 2.2 0.7 - 4.0 K/uL   Monocytes Relative 12 3 - 12 %   Monocytes Absolute 1.5 (H) 0.1 - 1.0 K/uL   Eosinophils Relative 1 0 - 5 %   Eosinophils Absolute 0.1 0.0 - 0.7 K/uL   Basophils Relative 0 0 - 1 %   Basophils Absolute 0.0 0.0 - 0.1 K/uL  Protime-INR  Result Value Ref Range   Prothrombin Time 12.7 11.6 - 15.2 seconds   INR 0.93 0.00 - 1.49  APTT  Result Value Ref Range   aPTT 26 24 - 37 seconds  Fibrinogen  Result Value Ref Range   Fibrinogen 364 204 - 475 mg/dL  D-dimer, quantitative  Result Value Ref Range   D-Dimer, Quant 1.10 (H) 0.00 - 0.48 ug/mL-FEU  Type and screen  Result Value Ref Range   ABO/RH(D) O POS    Antibody Screen NEG    Sample Expiration 05/26/2011   ABO/Rh  Result Value Ref Range   ABO/RH(D) O POS       Assessment & Plan:   1. Depression, recurrent (HCC) - citalopram (CELEXA) 40 MG tablet; Take 1 tablet (40 mg total) by mouth daily.  Dispense: 30 tablet; Refill: 2 - ALPRAZolam (XANAX) 1 MG tablet; Take 1 tablet (1 mg  total) by mouth daily as needed for anxiety.  Dispense: 30 tablet; Refill: 0  2. Anxiety - citalopram (CELEXA) 40 MG tablet; Take  1 tablet (40 mg total) by mouth daily.  Dispense: 30 tablet; Refill: 2 - ALPRAZolam (XANAX) 1 MG tablet; Take 1 tablet (1 mg total) by mouth daily as needed for anxiety.  Dispense: 30 tablet; Refill: 0   Continue all other maintenance medications as listed above.  Follow up plan: Return in about 4 weeks (around 09/24/2016).  Educational handout given for depression  Remus Loffler PA-C [facility] [address]  [place], [place] [address] [phone]   08/28/2016, 9:30 AM

## 2016-09-25 ENCOUNTER — Ambulatory Visit (INDEPENDENT_AMBULATORY_CARE_PROVIDER_SITE_OTHER): Payer: Medicaid Other | Admitting: Physician Assistant

## 2016-09-25 ENCOUNTER — Encounter: Payer: Self-pay | Admitting: Physician Assistant

## 2016-09-25 DIAGNOSIS — F419 Anxiety disorder, unspecified: Secondary | ICD-10-CM

## 2016-09-25 DIAGNOSIS — F339 Major depressive disorder, recurrent, unspecified: Secondary | ICD-10-CM | POA: Diagnosis not present

## 2016-09-25 MED ORDER — BUPROPION HCL ER (SR) 150 MG PO TB12: 150.0000 mg | ORAL_TABLET | Freq: Every day | ORAL | 6 refills | Status: DC

## 2016-09-25 MED ORDER — CITALOPRAM HYDROBROMIDE 40 MG PO TABS: 40.0000 mg | ORAL_TABLET | Freq: Every day | ORAL | 5 refills | Status: DC

## 2016-09-25 MED ORDER — ALPRAZOLAM 1 MG PO TABS: 1.0000 mg | ORAL_TABLET | Freq: Every day | ORAL | 5 refills | Status: DC | PRN

## 2016-09-25 NOTE — Patient Instructions (Addendum)
Generalized Anxiety Disorder, Adult Generalized anxiety disorder (GAD) is a mental health disorder. People with this condition constantly worry about everyday events. Unlike normal anxiety, worry related to GAD is not triggered by a specific event. These worries also do not fade or get better with time. GAD interferes with life functions, including relationships, work, and school. GAD can vary from mild to severe. People with severe GAD can have intense waves of anxiety with physical symptoms (panic attacks). What are the causes? The exact cause of GAD is not known. What increases the risk? This condition is more likely to develop in:  Women.  People who have a family history of anxiety disorders.  People who are very shy.  People who experience very stressful life events, such as the death of a loved one.  People who have a very stressful family environment. What are the signs or symptoms? People with GAD often worry excessively about many things in their lives, such as their health and family. They may also be overly concerned about:  Doing well at work.  Being on time.  Natural disasters.  Friendships. Physical symptoms of GAD include:  Fatigue.  Muscle tension or having muscle twitches.  Trembling or feeling shaky.  Being easily startled.  Feeling like your heart is pounding or racing.  Feeling out of breath or like you cannot take a deep breath.  Having trouble falling asleep or staying asleep.  Sweating.  Nausea, diarrhea, or irritable bowel syndrome (IBS).  Headaches.  Trouble concentrating or remembering facts.  Restlessness.  Irritability. How is this diagnosed? Your health care provider can diagnose GAD based on your symptoms and medical history. You will also have a physical exam. The health care provider will ask specific questions about your symptoms, including how severe they are, when they started, and if they come and go. Your health care  provider may ask you about your use of alcohol or drugs, including prescription medicines. Your health care provider may refer you to a mental health specialist for further evaluation. Your health care provider will do a thorough examination and may perform additional tests to rule out other possible causes of your symptoms. To be diagnosed with GAD, a person must have anxiety that:  Is out of his or her control.  Affects several different aspects of his or her life, such as work and relationships.  Causes distress that makes him or her unable to take part in normal activities.  Includes at least three physical symptoms of GAD, such as restlessness, fatigue, trouble concentrating, irritability, muscle tension, or sleep problems. Before your health care provider can confirm a diagnosis of GAD, these symptoms must be present more days than they are not, and they must last for six months or longer. How is this treated? The following therapies are usually used to treat GAD:  Medicine. Antidepressant medicine is usually prescribed for long-term daily control. Antianxiety medicines may be added in severe cases, especially when panic attacks occur.  Talk therapy (psychotherapy). Certain types of talk therapy can be helpful in treating GAD by providing support, education, and guidance. Options include:  Cognitive behavioral therapy (CBT). People learn coping skills and techniques to ease their anxiety. They learn to identify unrealistic or negative thoughts and behaviors and to replace them with positive ones.  Acceptance and commitment therapy (ACT). This treatment teaches people how to be mindful as a way to cope with unwanted thoughts and feelings.  Biofeedback. This process trains you to manage your body's response (  physiological response) through breathing techniques and relaxation methods. You will work with a therapist while machines are used to monitor your physical symptoms.  Stress  management techniques. These include yoga, meditation, and exercise. A mental health specialist can help determine which treatment is best for you. Some people see improvement with one type of therapy. However, other people require a combination of therapies. Follow these instructions at home:  Take over-the-counter and prescription medicines only as told by your health care provider.  Try to maintain a normal routine.  Try to anticipate stressful situations and allow extra time to manage them.  Practice any stress management or self-calming techniques as taught by your health care provider.  Do not punish yourself for setbacks or for not making progress.  Try to recognize your accomplishments, even if they are small.  Keep all follow-up visits as told by your health care provider. This is important. Contact a health care provider if:  Your symptoms do not get better.  Your symptoms get worse.  You have signs of depression, such as:  A persistently sad, cranky, or irritable mood.  Loss of enjoyment in activities that used to bring you joy.  Change in weight or eating.  Changes in sleeping habits.  Avoiding friends or family members.  Loss of energy for normal tasks.  Feelings of guilt or worthlessness. Get help right away if:  You have serious thoughts about hurting yourself or others. If you ever feel like you may hurt yourself or others, or have thoughts about taking your own life, get help right away. You can go to your nearest emergency department or call:  Your local emergency services (911 in the [place]  A suicide crisis helpline, such as the National Suicide Prevention Lifeline at (671)775-02021-978-500-9793. This is open 24 hours a day. Summary  Generalized anxiety disorder (GAD) is a mental health disorder that involves worry that is not triggered by a specific event.  People with GAD often worry excessively about many things in their lives, such as their health and  family.  GAD may cause physical symptoms such as restlessness, trouble concentrating, sleep problems, frequent sweating, nausea, diarrhea, headaches, and trembling or muscle twitching.  A mental health specialist can help determine which treatment is best for you. Some people see improvement with one type of therapy. However, other people require a combination of therapies. This information is not intended to replace advice given to you by your health care provider. Make sure you discuss any questions you have with your health care provider. Document Released: 08/17/2012 Document Revised: 03/12/2016 Document Reviewed: 03/12/2016 Elsevier Interactive Patient Education  2017 [employer] GAD

## 2016-09-27 NOTE — Progress Notes (Signed)
BP 107/61   Pulse 84   Temp 97.9 F (36.6 C) (Oral)   Ht 5\' 3"  (1.6 m)   Wt 155 lb 9.6 oz (70.6 kg)   BMI 27.56 kg/m    Subjective:    Patient ID: Megan Wiley, female    DOB: 04/28/1991, 26 y.o.   MRN: [id number]  HPI: Megan Wiley is a 26 y.o. female presenting on 09/25/2016 for Follow-up (1 month )  This patient comes in for periodic recheck on medications and conditions including depression and anxiety. She has a great amount of improvement.  Her PHQ is better..   All medications are reviewed today. There are no reports of any problems with the medications. All of the medical conditions are reviewed and updated.  Lab work is reviewed and will be ordered as medically necessary. There are no new problems reported with today's visit. Depression screen [facility] 2/9 09/25/2016 08/27/2016  Decreased Interest 1 2  Down, Depressed, Hopeless 2 3  PHQ - 2 Score 3 5  Altered sleeping 1 2  Tired, decreased energy 2 2  Change in appetite 2 3  Feeling bad or failure about yourself  3 3  Trouble concentrating 3 3  Moving slowly or fidgety/restless 0 2  Suicidal thoughts 0 0  PHQ-9 Score 14 20     Relevant past medical, surgical, family and social history reviewed and updated as indicated. Allergies and medications reviewed and updated.  Past Medical History:  Diagnosis Date  . No pertinent past medical history     Past Surgical History:  Procedure Laterality Date  . CESAREAN SECTION    . NO PAST SURGERIES    . TONSILLECTOMY      Review of Systems  Constitutional: Negative.  Negative for activity change, fatigue and fever.  HENT: Negative.   Eyes: Negative.   Respiratory: Negative.  Negative for cough.   Cardiovascular: Negative.  Negative for chest pain.  Gastrointestinal: Negative.  Negative for abdominal pain.  Endocrine: Negative.   Genitourinary: Negative.  Negative for dysuria.  Musculoskeletal: Negative.   Skin: Negative.   Neurological: Negative.     Allergies  as of 09/25/2016   No Known Allergies     Medication List       Accurate as of 09/25/16 11:59 PM. Always use your most recent med list.          ALPRAZolam 1 MG tablet Commonly known as:  XANAX Take 1 tablet (1 mg total) by mouth daily as needed for anxiety.   buPROPion 150 MG 12 hr tablet Commonly known as:  WELLBUTRIN SR Take 1 tablet (150 mg total) by mouth daily.   citalopram 40 MG tablet Commonly known as:  CELEXA Take 1 tablet (40 mg total) by mouth daily.          Objective:    BP 107/61   Pulse 84   Temp 97.9 F (36.6 C) (Oral)   Ht 5\' 3"  (1.6 m)   Wt 155 lb 9.6 oz (70.6 kg)   BMI 27.56 kg/m   No Known Allergies  Physical Exam  Constitutional: She is oriented to person, place, and time. She appears well-developed and well-nourished.  HENT:  Head: Normocephalic and atraumatic.  Eyes: Conjunctivae and EOM are normal. Pupils are equal, round, and reactive to light.  Cardiovascular: Normal rate, regular rhythm, normal heart sounds and intact distal pulses.   Pulmonary/Chest: Effort normal and breath sounds normal.  Abdominal: Soft. Bowel sounds are normal.  Neurological: She is  alert and oriented to person, place, and time. She has normal reflexes.  Skin: Skin is warm and dry. No rash noted.  Psychiatric: She has a normal mood and affect. Her behavior is normal. Judgment and thought content normal.    Results for orders placed or performed during the hospital encounter of 05/23/11  CBC  Result Value Ref Range   WBC 11.7 (H) 4.0 - 10.5 K/uL   RBC 3.94 3.87 - 5.11 MIL/uL   Hemoglobin 12.3 12.0 - 15.0 g/dL   HCT 16.1 09.6 - 04.5 %   MCV 91.9 78.0 - 100.0 fL   MCH 31.2 26.0 - 34.0 pg   MCHC 34.0 30.0 - 36.0 g/dL   RDW 40.9 81.1 - 91.4 %   Platelets 207 150 - 400 K/uL  Differential  Result Value Ref Range   Neutrophils Relative % 67 43 - 77 %   Neutro Abs 7.9 (H) 1.7 - 7.7 K/uL   Lymphocytes Relative 19 12 - 46 %   Lymphs Abs 2.2 0.7 - 4.0 K/uL    Monocytes Relative 12 3 - 12 %   Monocytes Absolute 1.5 (H) 0.1 - 1.0 K/uL   Eosinophils Relative 1 0 - 5 %   Eosinophils Absolute 0.1 0.0 - 0.7 K/uL   Basophils Relative 0 0 - 1 %   Basophils Absolute 0.0 0.0 - 0.1 K/uL  Protime-INR  Result Value Ref Range   Prothrombin Time 12.7 11.6 - 15.2 seconds   INR 0.93 0.00 - 1.49  APTT  Result Value Ref Range   aPTT 26 24 - 37 seconds  Fibrinogen  Result Value Ref Range   Fibrinogen 364 204 - 475 mg/dL  D-dimer, quantitative  Result Value Ref Range   D-Dimer, Quant 1.10 (H) 0.00 - 0.48 ug/mL-FEU  Type and screen  Result Value Ref Range   ABO/RH(D) O POS    Antibody Screen NEG    Sample Expiration 05/26/2011   ABO/Rh  Result Value Ref Range   ABO/RH(D) O POS       Assessment & Plan:   1. Depression, recurrent (HCC) - buPROPion (WELLBUTRIN SR) 150 MG 12 hr tablet; Take 1 tablet (150 mg total) by mouth daily.  Dispense: 30 tablet; Refill: 6 - citalopram (CELEXA) 40 MG tablet; Take 1 tablet (40 mg total) by mouth daily.  Dispense: 30 tablet; Refill: 5 - ALPRAZolam (XANAX) 1 MG tablet; Take 1 tablet (1 mg total) by mouth daily as needed for anxiety.  Dispense: 30 tablet; Refill: 5  2. Anxiety - citalopram (CELEXA) 40 MG tablet; Take 1 tablet (40 mg total) by mouth daily.  Dispense: 30 tablet; Refill: 5 - ALPRAZolam (XANAX) 1 MG tablet; Take 1 tablet (1 mg total) by mouth daily as needed for anxiety.  Dispense: 30 tablet; Refill: 5   Continue all other maintenance medications as listed above.  Follow up plan: Return in about 2 months (around 11/25/2016) for recheck.  Educational handout given for depression  Remus Loffler PA-C [facility] [address]  [place], [place] [address] [phone]   09/27/2016, 9:41 PM

## 2016-11-25 ENCOUNTER — Encounter: Payer: Self-pay | Admitting: Physician Assistant

## 2016-11-25 ENCOUNTER — Ambulatory Visit (INDEPENDENT_AMBULATORY_CARE_PROVIDER_SITE_OTHER): Payer: Self-pay | Admitting: Physician Assistant

## 2016-11-25 VITALS — BP 119/77 | HR 74 | Temp 98.8°F | Ht 63.0 in | Wt 154.4 lb

## 2016-11-25 DIAGNOSIS — N760 Acute vaginitis: Secondary | ICD-10-CM

## 2016-11-25 DIAGNOSIS — B9689 Other specified bacterial agents as the cause of diseases classified elsewhere: Secondary | ICD-10-CM

## 2016-11-25 DIAGNOSIS — F339 Major depressive disorder, recurrent, unspecified: Secondary | ICD-10-CM

## 2016-11-25 DIAGNOSIS — F419 Anxiety disorder, unspecified: Secondary | ICD-10-CM

## 2016-11-25 MED ORDER — ALPRAZOLAM 1 MG PO TABS: 1.0000 mg | ORAL_TABLET | Freq: Every day | ORAL | 5 refills | Status: DC | PRN

## 2016-11-25 MED ORDER — METRONIDAZOLE 500 MG PO TABS: 500.0000 mg | ORAL_TABLET | Freq: Two times a day (BID) | ORAL | 0 refills | Status: DC

## 2016-11-25 MED ORDER — CITALOPRAM HYDROBROMIDE 40 MG PO TABS: 40.0000 mg | ORAL_TABLET | Freq: Every day | ORAL | 5 refills | Status: DC

## 2016-11-25 NOTE — Progress Notes (Signed)
BP 119/77   Pulse 74   Temp 98.8 F (37.1 C) (Oral)   Ht 5\' 3"  (1.6 m)   Wt 154 lb 6.4 oz (70 kg)   BMI 27.35 kg/m    Subjective:    Patient ID: Megan Wiley, female    DOB: [birth year], 26 y.o.   MRN: [id number]  HPI: Megan Wiley is a 26 y.o. female presenting on 11/25/2016 for Follow-up (2 month )  This patient comes in for periodic recheck on medications and conditions including Depression and anxiety. She is also having a flareup of her bacterial vaginosis. She states she is having some discharge with odor. She has had a history of it in the past. Due to not having insurance she had been going to the health department for her gynecology care. She would like to have a prescription for this today. She has lost her insurance and is not able to get the Wellbutrin at this time. She has continue with the Celexa and alprazolam.  Depression screen [facility] 2/9 09/25/2016 08/27/2016  Decreased Interest 1 2  Down, Depressed, Hopeless 2 3  PHQ - 2 Score 3 5  Altered sleeping 1 2  Tired, decreased energy 2 2  Change in appetite 2 3  Feeling bad or failure about yourself  3 3  Trouble concentrating 3 3  Moving slowly or fidgety/restless 0 2  Suicidal thoughts 0 0  PHQ-9 Score 14 20   .   All medications are reviewed today. There are no reports of any problems with the medications. All of the medical conditions are reviewed and updated.  Lab work is reviewed and will be ordered as medically necessary. There are no new problems reported with today's visit.   Relevant past medical, surgical, family and social history reviewed and updated as indicated. Allergies and medications reviewed and updated.  Past Medical History:  Diagnosis Date  . No pertinent past medical history     Past Surgical History:  Procedure Laterality Date  . CESAREAN SECTION    . NO PAST SURGERIES    . TONSILLECTOMY      Review of Systems  Constitutional: Negative.  Negative for activity change, fatigue and  fever.  HENT: Negative.   Eyes: Negative.   Respiratory: Negative.  Negative for cough.   Cardiovascular: Negative.  Negative for chest pain.  Gastrointestinal: Negative.  Negative for abdominal pain.  Endocrine: Negative.   Genitourinary: Positive for vaginal discharge. Negative for dysuria.  Musculoskeletal: Negative.   Skin: Negative.   Neurological: Negative.   Psychiatric/Behavioral: Positive for decreased concentration. The patient is nervous/anxious.     Allergies as of 11/25/2016   No Known Allergies     Medication List       Accurate as of 11/25/16  4:17 PM. Always use your most recent med list.          ALPRAZolam 1 MG tablet Commonly known as:  XANAX Take 1 tablet (1 mg total) by mouth daily as needed for anxiety.   buPROPion 150 MG 12 hr tablet Commonly known as:  WELLBUTRIN SR Take 1 tablet (150 mg total) by mouth daily.   citalopram 40 MG tablet Commonly known as:  CELEXA Take 1 tablet (40 mg total) by mouth daily.   metroNIDAZOLE 500 MG tablet Commonly known as:  FLAGYL Take 1 tablet (500 mg total) by mouth 2 (two) times daily.          Objective:    BP 119/77   Pulse 74  Temp 98.8 F (37.1 C) (Oral)   Ht 5\' 3"  (1.6 m)   Wt 154 lb 6.4 oz (70 kg)   BMI 27.35 kg/m   No Known Allergies  Physical Exam  Constitutional: She is oriented to person, place, and time. She appears well-developed and well-nourished.  HENT:  Head: Normocephalic and atraumatic.  Eyes: Pupils are equal, round, and reactive to light. Conjunctivae and EOM are normal.  Cardiovascular: Normal rate, regular rhythm, normal heart sounds and intact distal pulses.   Pulmonary/Chest: Effort normal and breath sounds normal.  Abdominal: Soft. Bowel sounds are normal.  Neurological: She is alert and oriented to person, place, and time. She has normal reflexes.  Skin: Skin is warm and dry. No rash noted.  Psychiatric: She has a normal mood and affect. Her behavior is normal.  Judgment and thought content normal.  Nursing note and vitals reviewed.       Assessment & Plan:   1. Depression, recurrent (HCC) - ALPRAZolam (XANAX) 1 MG tablet; Take 1 tablet (1 mg total) by mouth daily as needed for anxiety.  Dispense: 30 tablet; Refill: 5 - citalopram (CELEXA) 40 MG tablet; Take 1 tablet (40 mg total) by mouth daily.  Dispense: 30 tablet; Refill: 5  2. Anxiety - ALPRAZolam (XANAX) 1 MG tablet; Take 1 tablet (1 mg total) by mouth daily as needed for anxiety.  Dispense: 30 tablet; Refill: 5 - citalopram (CELEXA) 40 MG tablet; Take 1 tablet (40 mg total) by mouth daily.  Dispense: 30 tablet; Refill: 5  3. BV (bacterial vaginosis) - metroNIDAZOLE (FLAGYL) 500 MG tablet; Take 1 tablet (500 mg total) by mouth 2 (two) times daily.  Dispense: 14 tablet; Refill: 0   Current Outpatient Prescriptions:  .  ALPRAZolam (XANAX) 1 MG tablet, Take 1 tablet (1 mg total) by mouth daily as needed for anxiety., Disp: 30 tablet, Rfl: 5 .  buPROPion (WELLBUTRIN SR) 150 MG 12 hr tablet, Take 1 tablet (150 mg total) by mouth daily., Disp: 30 tablet, Rfl: 6 .  citalopram (CELEXA) 40 MG tablet, Take 1 tablet (40 mg total) by mouth daily., Disp: 30 tablet, Rfl: 5 .  metroNIDAZOLE (FLAGYL) 500 MG tablet, Take 1 tablet (500 mg total) by mouth 2 (two) times daily., Disp: 14 tablet, Rfl: 0  Continue all other maintenance medications as listed above.  Follow up plan: Return in about 6 months (around 05/28/2017) for recheck.  Educational handout given for survey  Remus LofflerAngel S. Wonder Donaway PA-C [facility] [address]  [place], [place] [id number] [phone]   11/25/2016, 4:17 PM

## 2016-11-25 NOTE — Patient Instructions (Signed)
In a few days you may receive a survey in the mail or online from [employer] regarding your visit with us today. Please take a moment to fill this out. Your feedback is very important to our whole office. It can help us better understand your needs as well as improve your experience and satisfaction. Thank you for taking your time to complete it. We care about you.  Pippa Hanif, PA-C  

## 2016-12-20 ENCOUNTER — Telehealth: Payer: Self-pay | Admitting: Physician Assistant

## 2016-12-20 NOTE — Telephone Encounter (Signed)
Patient wants to know if you can start her on a birth control pill? Last menstrual cycle 12/06/2016

## 2016-12-23 MED ORDER — DESOGESTREL-ETHINYL ESTRADIOL 0.15-0.02/0.01 MG (21/5) PO TABS: 1.0000 | ORAL_TABLET | Freq: Every day | ORAL | 11 refills | Status: DC

## 2016-12-23 NOTE — Telephone Encounter (Signed)
Please start on the first day of next menses. Script has been sent.

## 2016-12-23 NOTE — Telephone Encounter (Signed)
Pt notified of RX Verbalizes understanding 

## 2017-04-25 ENCOUNTER — Other Ambulatory Visit: Payer: Self-pay | Admitting: Physician Assistant

## 2017-04-25 DIAGNOSIS — F419 Anxiety disorder, unspecified: Secondary | ICD-10-CM

## 2017-04-25 DIAGNOSIS — F339 Major depressive disorder, recurrent, unspecified: Secondary | ICD-10-CM

## 2017-04-30 MED ORDER — ALPRAZOLAM 1 MG PO TABS: ORAL_TABLET | ORAL | 0 refills | Status: DC

## 2017-04-30 NOTE — Telephone Encounter (Signed)
Last seen 11/25/16  Megan Wiley  If approved route to nurse to call into [facility]

## 2017-04-30 NOTE — Telephone Encounter (Signed)
We have received a conformation receipt from pharmacy.

## 2017-05-28 ENCOUNTER — Encounter: Payer: Self-pay | Admitting: Physician Assistant

## 2017-05-28 ENCOUNTER — Ambulatory Visit (INDEPENDENT_AMBULATORY_CARE_PROVIDER_SITE_OTHER): Payer: Self-pay | Admitting: Physician Assistant

## 2017-05-28 VITALS — BP 106/63 | HR 84 | Temp 98.2°F | Ht 63.0 in | Wt 147.0 lb

## 2017-05-28 DIAGNOSIS — F339 Major depressive disorder, recurrent, unspecified: Secondary | ICD-10-CM

## 2017-05-28 DIAGNOSIS — F419 Anxiety disorder, unspecified: Secondary | ICD-10-CM

## 2017-05-28 DIAGNOSIS — Z3041 Encounter for surveillance of contraceptive pills: Secondary | ICD-10-CM

## 2017-05-28 MED ORDER — DESOGESTREL-ETHINYL ESTRADIOL 0.15-0.02/0.01 MG (21/5) PO TABS: 1.0000 | ORAL_TABLET | Freq: Every day | ORAL | 11 refills | Status: DC

## 2017-05-28 MED ORDER — CITALOPRAM HYDROBROMIDE 40 MG PO TABS: 40.0000 mg | ORAL_TABLET | Freq: Every day | ORAL | 5 refills | Status: DC

## 2017-05-28 MED ORDER — ALPRAZOLAM 1 MG PO TABS: ORAL_TABLET | ORAL | 2 refills | Status: DC
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
## 2017-05-28 NOTE — Patient Instructions (Addendum)
In a few days you may receive a survey in the mail or online from [employer] regarding your visit with us today. Please take a moment to fill this out. Your feedback is very important to our whole office. It can help us better understand your needs as well as improve your experience and satisfaction. Thank you for taking your time to complete it. We care about you.  Prudy FeelerAngel Eliseo Withers, PA-C   [employer] [employer] [employer] [phone]

## 2017-05-30 ENCOUNTER — Telehealth (HOSPITAL_COMMUNITY): Payer: Self-pay

## 2017-05-30 ENCOUNTER — Encounter: Payer: Self-pay | Admitting: Physician Assistant

## 2017-05-30 DIAGNOSIS — Z3041 Encounter for surveillance of contraceptive pills: Secondary | ICD-10-CM | POA: Insufficient documentation

## 2017-05-30 NOTE — Telephone Encounter (Signed)
[facility] - Patient was at work and not able to talk.  Patient reports that I can call her back tomorrow in order to speak to her.

## 2017-06-07 ENCOUNTER — Telehealth: Payer: Self-pay

## 2017-06-07 NOTE — Telephone Encounter (Signed)
[facility] - left message.  

## 2017-06-17 ENCOUNTER — Telehealth: Payer: Self-pay

## 2017-06-17 NOTE — Telephone Encounter (Signed)
[facility]   Patient reports that she is doient homework with her child and I can call her back tomorrow.

## 2017-06-19 ENCOUNTER — Telehealth: Payer: Self-pay

## 2017-06-19 NOTE — Telephone Encounter (Signed)
[facility] - Patient reports that she is in an appointment with her daughter and not able to speak.

## 2017-06-25 ENCOUNTER — Telehealth: Payer: Self-pay

## 2017-06-25 NOTE — Telephone Encounter (Signed)
[facility] - left message.  

## 2017-06-26 ENCOUNTER — Telehealth: Payer: Self-pay | Admitting: Physician Assistant

## 2017-06-26 DIAGNOSIS — F419 Anxiety disorder, unspecified: Secondary | ICD-10-CM

## 2017-06-26 DIAGNOSIS — F339 Major depressive disorder, recurrent, unspecified: Secondary | ICD-10-CM

## 2017-06-27 MED ORDER — ALPRAZOLAM 1 MG PO TABS
1.0000 mg | ORAL_TABLET | Freq: Two times a day (BID) | ORAL | 2 refills | Status: DC | PRN
Start: 2017-06-27 — End: 2017-08-23

## 2017-06-27 NOTE — Telephone Encounter (Signed)
Patient aware that corrected Rx sent to pharmacy

## 2017-06-27 NOTE — Telephone Encounter (Signed)
Script was re-sent.

## 2017-07-23 ENCOUNTER — Telehealth: Payer: Self-pay

## 2017-07-23 NOTE — Telephone Encounter (Signed)
[facility]- unable to contact patient. Left message on 07-23-2017, 07-08-2017, 06-25-2017, 06-19-2017. Routed information to the PCP and Dr. Vanetta ShawlHisada

## 2017-08-14 ENCOUNTER — Telehealth: Payer: Self-pay

## 2017-08-14 DIAGNOSIS — F419 Anxiety disorder, unspecified: Secondary | ICD-10-CM
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
## 2017-08-20 ENCOUNTER — Telehealth: Payer: Self-pay

## 2017-08-20 DIAGNOSIS — F339 Major depressive disorder, recurrent, unspecified: Secondary | ICD-10-CM

## 2017-08-20 DIAGNOSIS — F419 Anxiety disorder, unspecified: Secondary | ICD-10-CM
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
## 2017-08-23 ENCOUNTER — Other Ambulatory Visit: Payer: Self-pay | Admitting: Physician Assistant

## 2017-08-23 DIAGNOSIS — F339 Major depressive disorder, recurrent, unspecified: Secondary | ICD-10-CM

## 2017-08-23 DIAGNOSIS — F419 Anxiety disorder, unspecified: Secondary | ICD-10-CM

## 2017-08-25 NOTE — Telephone Encounter (Signed)
Last seen 04/07/18  Angel 

## 2017-08-26 ENCOUNTER — Telehealth: Payer: Self-pay

## 2017-08-26 NOTE — Telephone Encounter (Signed)
Writer consulted with Dr. Lucianne MussKumar regarding the intake assessment with this patient on 4/11/2219t.  Writer routed the intake assessment to Dr. Vanetta ShawlHisada on 08/26/2017

## 2017-08-27 ENCOUNTER — Telehealth (HOSPITAL_COMMUNITY): Payer: Self-pay

## 2017-08-27 NOTE — Telephone Encounter (Signed)
[facility] - Writer discussed case with Dr. Lucianne MussKumar and there are no medication recommendations for this patient.

## 2017-09-03 ENCOUNTER — Ambulatory Visit: Payer: Self-pay | Admitting: Physician Assistant

## 2017-09-12 ENCOUNTER — Ambulatory Visit: Payer: Self-pay | Admitting: Physician Assistant

## 2017-09-12 ENCOUNTER — Telehealth: Payer: Self-pay | Admitting: Physician Assistant

## 2017-09-12 ENCOUNTER — Other Ambulatory Visit: Payer: Self-pay | Admitting: Physician Assistant

## 2017-09-12 MED ORDER — METRONIDAZOLE 500 MG PO TABS: 500.0000 mg | ORAL_TABLET | Freq: Two times a day (BID) | ORAL | 0 refills | Status: DC

## 2017-09-12 NOTE — Telephone Encounter (Signed)
Sent medication

## 2017-09-12 NOTE — Telephone Encounter (Signed)
Aware medication has been sent to pharmacy 

## 2017-09-12 NOTE — Progress Notes (Signed)
, °

## 2017-09-16 ENCOUNTER — Encounter: Payer: Self-pay | Admitting: Physician Assistant

## 2017-09-16 ENCOUNTER — Ambulatory Visit (INDEPENDENT_AMBULATORY_CARE_PROVIDER_SITE_OTHER): Payer: Self-pay | Admitting: Physician Assistant

## 2017-09-16 VITALS — BP 116/74 | HR 100 | Temp 98.8°F | Ht 63.0 in | Wt 165.0 lb

## 2017-09-16 DIAGNOSIS — F419 Anxiety disorder, unspecified: Secondary | ICD-10-CM

## 2017-09-16 DIAGNOSIS — Z01419 Encounter for gynecological examination (general) (routine) without abnormal findings: Secondary | ICD-10-CM

## 2017-09-16 DIAGNOSIS — F339 Major depressive disorder, recurrent, unspecified: Secondary | ICD-10-CM

## 2017-09-16 MED ORDER — ALPRAZOLAM 1 MG PO TABS: ORAL_TABLET | ORAL | 5 refills | Status: DC

## 2017-09-16 MED ORDER — DESOGESTREL-ETHINYL ESTRADIOL 0.15-0.02/0.01 MG (21/5) PO TABS: 1.0000 | ORAL_TABLET | Freq: Every day | ORAL | 12 refills | Status: DC

## 2017-09-16 MED ORDER — CITALOPRAM HYDROBROMIDE 40 MG PO TABS: 40.0000 mg | ORAL_TABLET | Freq: Every day | ORAL | 5 refills | Status: DC

## 2017-09-16 MED ORDER — BUPROPION HCL ER (SR) 150 MG PO TB12: 150.0000 mg | ORAL_TABLET | Freq: Every day | ORAL | 6 refills | Status: DC

## 2017-09-17 ENCOUNTER — Encounter: Payer: Self-pay | Admitting: Physician Assistant

## 2017-09-17 NOTE — Progress Notes (Signed)
BP 116/74   Pulse 100   Temp 98.8 F (37.1 C) (Oral)   Ht  (1.6 m)   Wt 165 lb (74.8 kg)   BMI 29.23 kg/m    Subjective:    Patient ID: Megan Wiley, female    DOB: [birth year], 27 y.o.   MRN: [id number]  HPI: Megan Wiley is a 27 y.o. female presenting on 09/16/2017 for No chief complaint on file.  This patient comes in for annual well physical examination. All medications are reviewed today. There are no reports of any problems with the medications. All of the medical conditions are reviewed and updated.  Lab work is reviewed and will be ordered as medically necessary. There are no new problems reported with today's visit.  Patient reports doing well overall.   History reviewed. No pertinent past medical history. Relevant past medical, surgical, family and social history reviewed and updated as indicated. Interim medical history since our last visit reviewed. Allergies and medications reviewed and updated. DATA REVIEWED: CHART IN EPIC  Family History reviewed for pertinent findings.  Review of Systems  Constitutional: Negative.  Negative for activity change, fatigue and fever.  HENT: Negative.   Eyes: Negative.   Respiratory: Negative.  Negative for cough.   Cardiovascular: Negative.  Negative for chest pain.  Gastrointestinal: Negative.  Negative for abdominal pain.  Endocrine: Negative.   Genitourinary: Negative.  Negative for dysuria.  Musculoskeletal: Negative.   Skin: Negative.   Neurological: Negative.     Allergies as of 09/16/2017   No Known Allergies     Medication List        Accurate as of 09/16/17 11:59 PM. Always use your most recent med list.          ALPRAZolam 1 MG tablet Commonly known as:  XANAX TAKE 1 TABLET TWICE DAILY AS NEEDED FOR ANXIETY   buPROPion 150 MG 12 hr tablet Commonly known as:  WELLBUTRIN SR Take 1 tablet (150 mg total) by mouth daily.   citalopram 40 MG tablet Commonly known as:  CELEXA Take 1 tablet (40 mg  total) by mouth daily.   desogestrel-ethinyl estradiol 0.15-0.02/0.01 MG (21/5) tablet Commonly known as:  KARIVA Take 1 tablet by mouth daily.   metroNIDAZOLE 500 MG tablet Commonly known as:  FLAGYL Take 1 tablet (500 mg total) by mouth 2 (two) times daily.          Objective:    BP 116/74   Pulse 100   Temp 98.8 F (37.1 C) (Oral)   Ht  (1.6 m)   Wt 165 lb (74.8 kg)   BMI 29.23 kg/m   No Known Allergies  Wt Readings from Last 3 Encounters:  09/16/17 165 lb (74.8 kg)  05/28/17 147 lb (66.7 kg)  11/25/16 154 lb 6.4 oz (70 kg)    Physical Exam  Constitutional: She is oriented to person, place, and time. She appears well-developed and well-nourished.  HENT:  Head: Normocephalic and atraumatic.  Eyes: Pupils are equal, round, and reactive to light. Conjunctivae and EOM are normal.  Neck: Normal range of motion. Neck supple.  Cardiovascular: Normal rate, regular rhythm, normal heart sounds and intact distal pulses.  Pulmonary/Chest: Effort normal and breath sounds normal. Right breast exhibits no mass, no skin change and no tenderness. Left breast exhibits no mass, no skin change and no tenderness. No breast tenderness, discharge or bleeding. Breasts are symmetrical.  Abdominal: Soft. Bowel sounds are normal.  Genitourinary: Vagina normal and uterus normal. Rectal  exam shows no fissure. No breast tenderness, discharge or bleeding. There is no tenderness or lesion on the right labia. There is no tenderness or lesion on the left labia. Uterus is not deviated, not enlarged and not tender. Cervix exhibits no motion tenderness, no discharge and no friability. Right adnexum displays no mass, no tenderness and no fullness. Left adnexum displays no mass, no tenderness and no fullness. No tenderness or bleeding in the vagina. No vaginal discharge found.  Neurological: She is alert and oriented to person, place, and time. She has normal reflexes.  Skin: Skin is warm and dry. No rash  noted.  Psychiatric: She has a normal mood and affect. Her behavior is normal. Judgment and thought content normal.        Assessment & Plan:   1. Well female exam with routine gynecological exam - desogestrel-ethinyl estradiol (KARIVA) 0.15-0.02/0.01 MG (21/5) tablet; Take 1 tablet by mouth daily.  Dispense: 1 Package; Refill: 12 - IGP, rfx Aptima HPV ASCU  2. Depression, recurrent (HCC) - citalopram (CELEXA) 40 MG tablet; Take 1 tablet (40 mg total) by mouth daily.  Dispense: 30 tablet; Refill: 5 - ALPRAZolam (XANAX) 1 MG tablet; TAKE 1 TABLET TWICE DAILY AS NEEDED FOR ANXIETY  Dispense: 60 tablet; Refill: 5 - buPROPion (WELLBUTRIN SR) 150 MG 12 hr tablet; Take 1 tablet (150 mg total) by mouth daily.  Dispense: 30 tablet; Refill: 6  3. Anxiety - citalopram (CELEXA) 40 MG tablet; Take 1 tablet (40 mg total) by mouth daily.  Dispense: 30 tablet; Refill: 5 - ALPRAZolam (XANAX) 1 MG tablet; TAKE 1 TABLET TWICE DAILY AS NEEDED FOR ANXIETY  Dispense: 60 tablet; Refill: 5   Continue all other maintenance medications as listed above.  Follow up plan: Return in about 6 months (around 03/19/2018) for recheck.  Educational handout given for survey  Remus Loffler PA-C [facility] [address]  [place], [place] [address] [phone]   09/17/2017, 1:29 PM

## 2017-09-19 LAB — IGP, RFX APTIMA HPV ASCU: PAP Smear Comment: 0

## 2017-10-17 ENCOUNTER — Telehealth: Payer: Self-pay | Admitting: Clinical

## 2017-10-17 NOTE — Telephone Encounter (Signed)
Behavioral health intern called patient. Patient answered, but was at work. Due to this patient was not able to talk. However, patient stated that her medication is working well and that she is doing well. Patient has the contact information for [facility].    Lyndee HensenSudheera Ranaweera  Behavioral Health Intern

## 2017-11-25 ENCOUNTER — Telehealth: Payer: Self-pay

## 2017-11-25 NOTE — Telephone Encounter (Signed)
[facility] - Left Msg 

## 2017-12-09 ENCOUNTER — Telehealth: Payer: Self-pay

## 2017-12-09 DIAGNOSIS — F419 Anxiety disorder, unspecified: Secondary | ICD-10-CM

## 2017-12-09 DIAGNOSIS — F339 Major depressive disorder, recurrent, unspecified: Secondary | ICD-10-CM

## 2017-12-09 NOTE — BH Specialist Note (Signed)
Virtual BH Telephone Follow-up  MRN: [id number] NAME: Megan Wiley Date: 12/17/17   Total time: 30 minutes Call number: 3/6  Reason for call today:  Phone VBH Follow Up    PHQ-9 Scores:  Depression screen [facility] 2/9 17-Dec-2017 09/16/2017 08/14/2017 05/28/2017 05/28/2017  Decreased Interest 0 1 0 2 0  Down, Depressed, Hopeless 0 1 0 1 0  PHQ - 2 Score 0 2 0 3 0  Altered sleeping 0 0 0 2 -  Tired, decreased energy 0 1 0 1 -  Change in appetite 0 0 0 2 -  Feeling bad or failure about yourself  0 0 0 2 -  Trouble concentrating 0 2 0 2 -  Moving slowly or fidgety/restless 0 0 0 0 -  Suicidal thoughts 0 0 0 0 -  PHQ-9 Score 0 5 0 12 -  Difficult doing work/chores Not difficult at all - Not difficult at all - -    GAD-7 Scores:  GAD 7 : Generalized Anxiety Score 12-17-17 08/14/2017  Nervous, Anxious, on Edge 0 0  Control/stop worrying 0 0  Worry too much - different things 0 0  Trouble relaxing 0 0  Restless 0 0  Easily annoyed or irritable 0 0  Afraid - awful might happen 0 0  Total GAD 7 Score 0 0  Anxiety Difficulty Not difficult at all -    Stress Current stressors: Current Stressors: (None Reported) Sleep: Sleep: No problems Appetite: Appetite: No problems Coping ability: Coping ability: Normal  Patient taking medications as prescribed: Patient taking medications as prescribed: Yes  Current medications:  Outpatient Encounter Medications as of 2017/12/17  Medication Sig  . ALPRAZolam (XANAX) 1 MG tablet TAKE 1 TABLET TWICE DAILY AS NEEDED FOR ANXIETY  . buPROPion (WELLBUTRIN SR) 150 MG 12 hr tablet Take 1 tablet (150 mg total) by mouth daily.  . citalopram (CELEXA) 40 MG tablet Take 1 tablet (40 mg total) by mouth daily.  Marland Kitchen desogestrel-ethinyl estradiol (KARIVA) 0.15-0.02/0.01 MG (21/5) tablet Take 1 tablet by mouth daily.  . metroNIDAZOLE (FLAGYL) 500 MG tablet Take 1 tablet (500 mg total) by mouth 2 (two) times daily.   No facility-administered encounter  medications on file as of 2017-12-17.      Self-harm Behaviors Risk Assessment Self-harm risk factors: Self-harm risk factors: (None Reported) Patient endorses recent thoughts of harming self: Have you recently had any thoughts about harming yourself?: No  [place] Suicide Severity Rating Scale: No flowsheet data found. C-SRSS 2017-12-17  1. Wish to be Dead No  2. Suicidal Thoughts No  6. Suicide Behavior Question No     Danger to Others Risk Assessment Danger to others risk factors: Danger to Others Risk Factors: No risk factors noted Patient endorses recent thoughts of harming others: Notification required: No need or identified person    Substance Use Assessment Patient recently consumed alcohol: Have you recently consumed alcohol?: No  Alcohol Use Disorder Identification Test (AUDIT):  Alcohol Use Disorder Test (AUDIT) 12/17/2017  1. How often do you have a drink containing alcohol? 0  2. How many drinks containing alcohol do you have on a typical day when you are drinking? 0  3. How often do you have six or more drinks on one occasion? 0  AUDIT-C Score 0  Intervention/Follow-up AUDIT Score <7 follow-up not indicated   Patient recently used drugs: Have you recently used any drugs?: No    Goals, Interventions and Follow-up Plan Goals: Increase healthy adjustment to current life circumstances  Interventions: Motivational Interviewing and Supportive Counseling Follow-up Plan: VBH Phone Follow Up   Summary:   Follow Up - Patient reports an improvement with her depression and anxiety.  Patient reports compliance with taking her psychiatric medication.  Patient denies any side effects with her current medication.   Patient denies any issues with sleep or appetite.  Patient reports that she only has time for her children.  Patient reports that she only participates in activities that involve her two children  Patient denies SI/HI/Psychosis/Substance Abuse. If your symptoms worsen or  you have thoughts of suicide/homicide, PLEASE SEEK IMMEDIATE MEDICAL ATTENTION.  You may always call:  National Suicide Hotline: 816-433-7630508-568-5579;  [place] Crisis Line: 438-274-4093331 327 7728;  [place]ris[place] in [place]: 626-847-5631445-684-4693.  These are available 24 hours a day, 7 days a week.   During the next session, patient will discuss her feelings about not wanting to do things for fun for herself.  Writing discussed problem solving techniques.      Phillip HealStevenson, Sherl Yzaguirre LaVerne, LCAS-A

## 2017-12-10 NOTE — Progress Notes (Signed)
Patient with low PHQ9/GAD7 score.  In general, we would recommend to continue antidepressant for 6-9 months to prevent relapse for patients with less than two prior mood episodes. Consider tapering off Xanax if able. BH specialist to discuss relapse prevention.

## 2017-12-14 IMAGING — CR DG ANKLE COMPLETE 3+V*L*
3 series · 3 of 3 positions shown · non-contrast
Comparison: None.

CLINICAL DATA: Twisted ankle playing softball approximately 1 week
ago, injury again this morning. Pain laterally.

EXAM:
LEFT ANKLE COMPLETE - 3+ VIEW

[x ankle ap left]
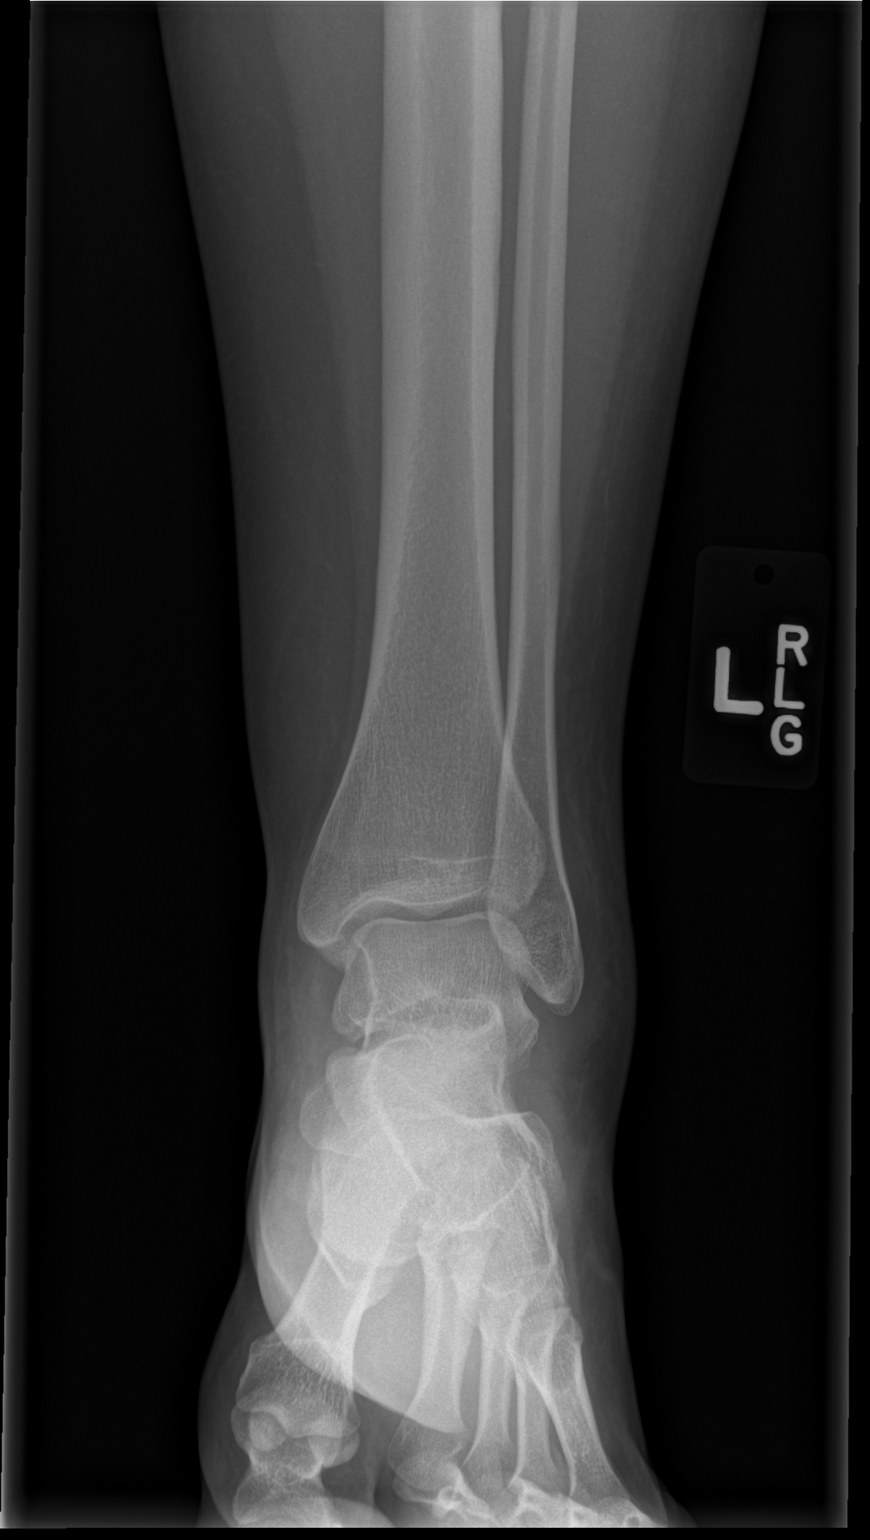

[x ankle obl left]
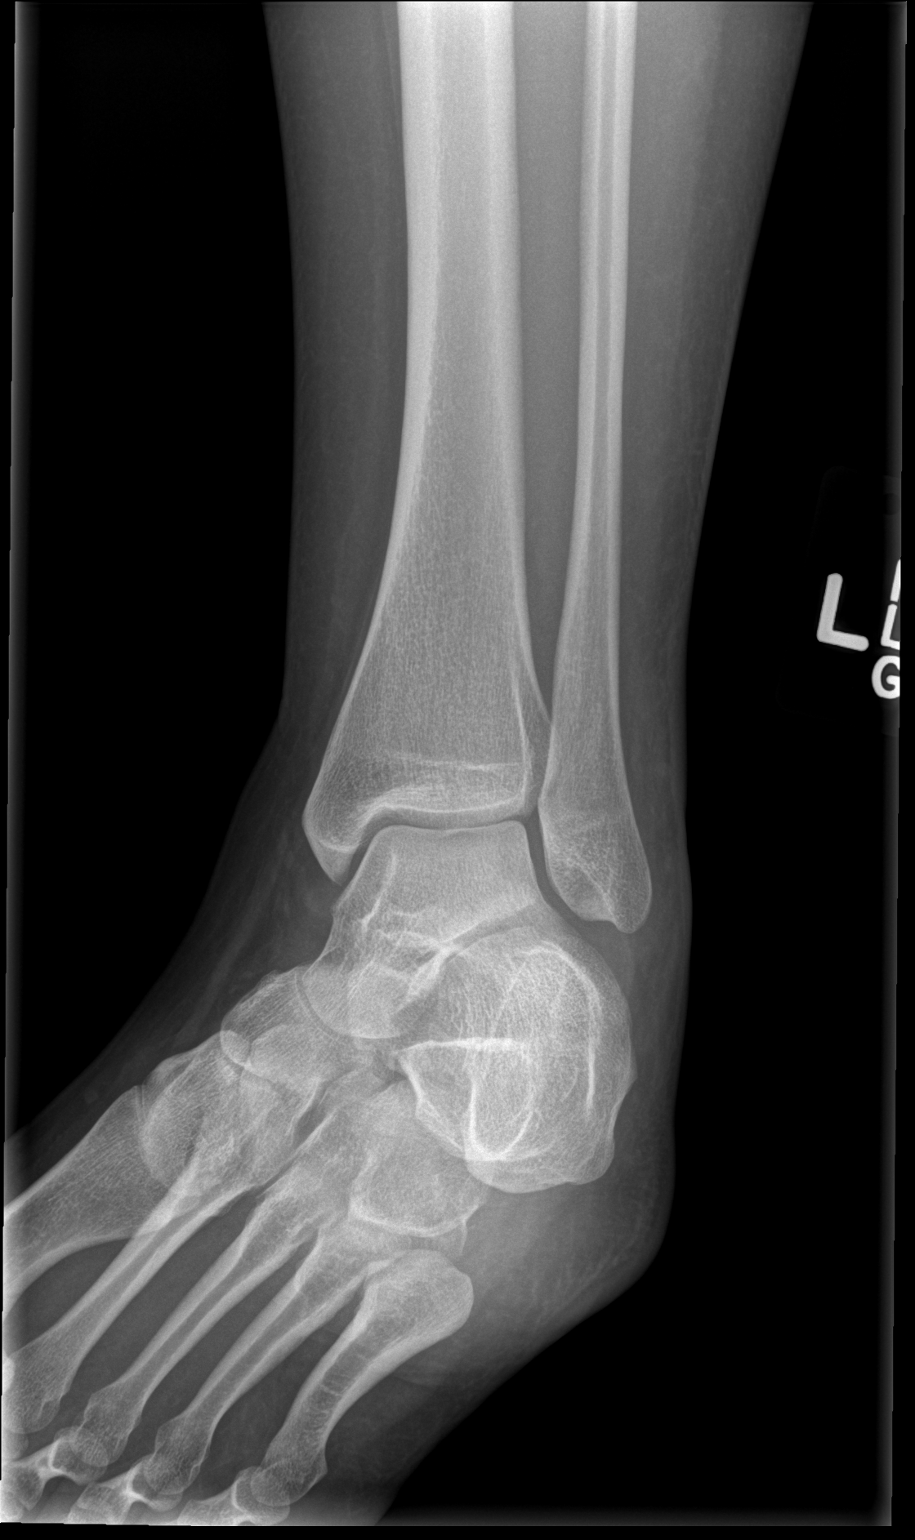

[x ankle lat left]
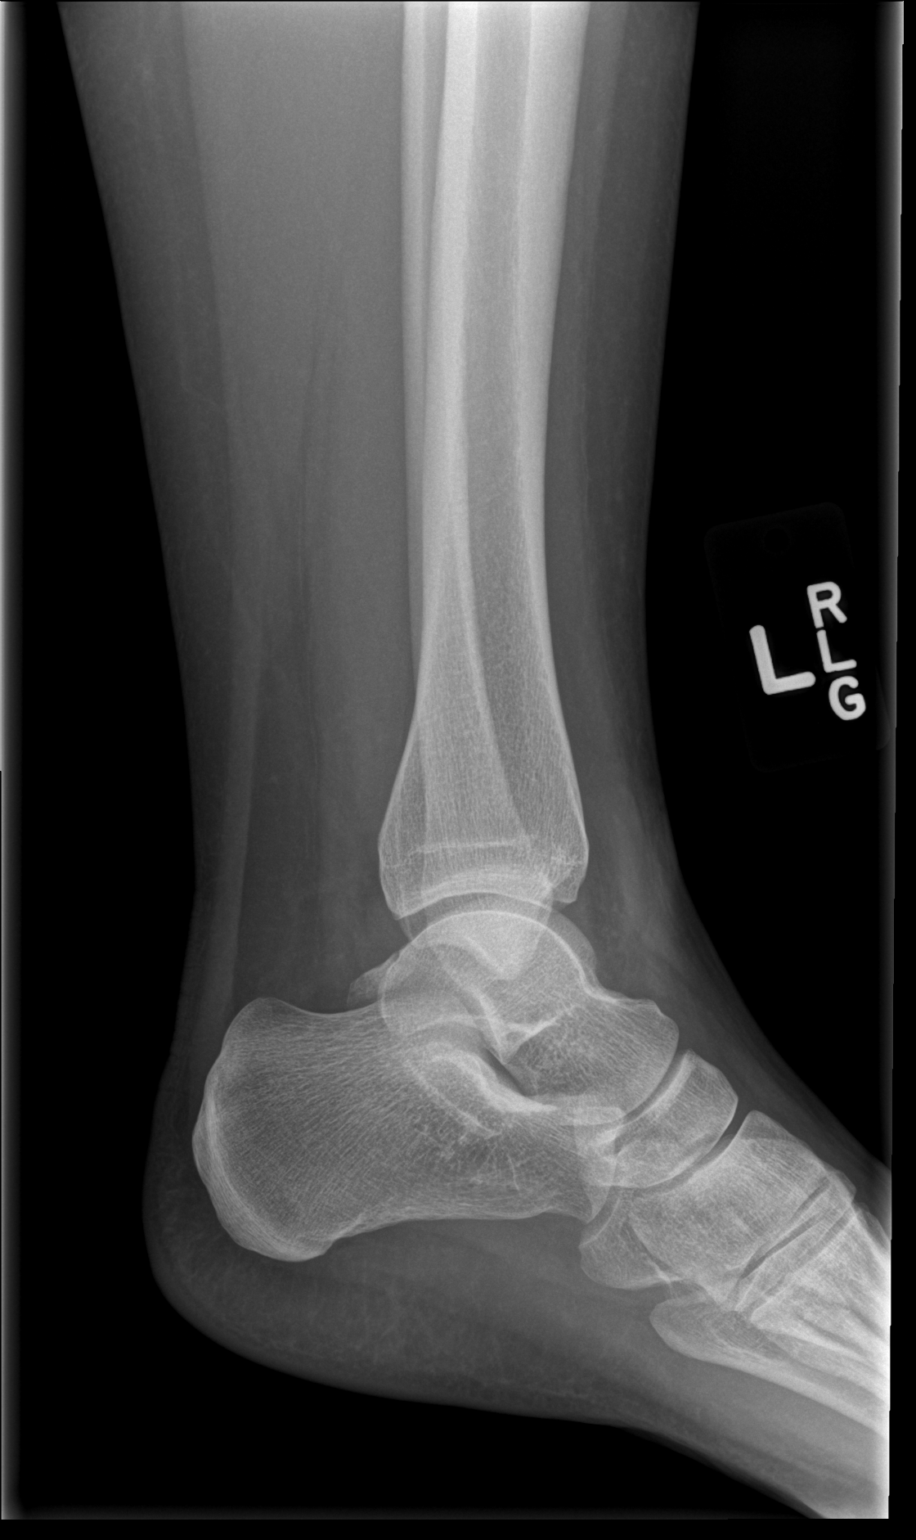

[3 of 3 positions shown; findings below may reference images not displayed]

FINDINGS: Osseous alignment is normal. Bone mineralization is normal. No
fracture line or displaced fracture fragment seen. Ankle mortise is
symmetric. Visualized osseous structures of the hindfoot and midfoot
appear intact and normally aligned.

There is soft tissue swelling, most prominent laterally. Soft
tissues about the left ankle are otherwise unremarkable.
IMPRESSION: Soft tissue swelling.  No osseous fracture or dislocation.

## 2017-12-16 ENCOUNTER — Telehealth: Payer: Self-pay

## 2017-12-16 NOTE — Telephone Encounter (Signed)
[facility] - Left Msg 

## 2018-01-13 ENCOUNTER — Telehealth: Payer: Self-pay

## 2018-01-13 NOTE — Telephone Encounter (Signed)
VBH - Several attempts have been made to contact patient without success. Patient will be placed on the inactive list.  If services are needed again.  Please contact VBH at [phone].    Information will be routed to the PCP and Dr. Hisada  

## 2018-01-22 ENCOUNTER — Telehealth: Payer: Self-pay | Admitting: Physician Assistant

## 2018-01-22 NOTE — Telephone Encounter (Signed)
PT wants to know if we can send a medication for bacteria vaginosis  Pharmacy [facility]

## 2018-01-23 ENCOUNTER — Other Ambulatory Visit: Payer: Self-pay | Admitting: Physician Assistant

## 2018-01-23 MED ORDER — METRONIDAZOLE 500 MG PO TABS: 500.0000 mg | ORAL_TABLET | Freq: Two times a day (BID) | ORAL | 0 refills | Status: DC

## 2018-01-23 NOTE — Progress Notes (Signed)
Flagyl

## 2018-01-23 NOTE — Telephone Encounter (Signed)
Sent to [facility] 

## 2018-01-23 NOTE — Telephone Encounter (Signed)
Patient aware.

## 2018-03-18 ENCOUNTER — Ambulatory Visit: Payer: Self-pay | Admitting: Physician Assistant

## 2018-03-25 ENCOUNTER — Encounter: Payer: Self-pay | Admitting: Physician Assistant

## 2018-03-25 ENCOUNTER — Other Ambulatory Visit: Payer: Self-pay | Admitting: Physician Assistant

## 2018-03-25 DIAGNOSIS — F339 Major depressive disorder, recurrent, unspecified: Secondary | ICD-10-CM

## 2018-03-25 DIAGNOSIS — F419 Anxiety disorder, unspecified: Secondary | ICD-10-CM

## 2018-04-20 ENCOUNTER — Encounter: Payer: Self-pay | Admitting: Physician Assistant

## 2018-04-20 ENCOUNTER — Ambulatory Visit (INDEPENDENT_AMBULATORY_CARE_PROVIDER_SITE_OTHER): Payer: Self-pay | Admitting: Physician Assistant

## 2018-04-20 DIAGNOSIS — F419 Anxiety disorder, unspecified: Secondary | ICD-10-CM

## 2018-04-20 DIAGNOSIS — F339 Major depressive disorder, recurrent, unspecified: Secondary | ICD-10-CM

## 2018-04-20 MED ORDER — ALPRAZOLAM 1 MG PO TABS: ORAL_TABLET | ORAL | 5 refills | Status: DC

## 2018-04-20 MED ORDER — CITALOPRAM HYDROBROMIDE 40 MG PO TABS: 40.0000 mg | ORAL_TABLET | Freq: Every day | ORAL | 11 refills | Status: DC

## 2018-04-20 NOTE — Progress Notes (Signed)
BP 125/78   Pulse 87   Temp 98.1 F (36.7 C) (Oral)   Ht 5\' 3"  (1.6 m)   Wt 191 lb (86.6 kg)   BMI 33.83 kg/m    Subjective:    Patient ID: Megan Wiley, female    DOB: 11/08/1990, 27 y.o.   MRN: [id number]  HPI: Megan Wiley is a 27 y.o. female presenting on 04/20/2018 for Anxiety (6 MONTH )  This patient comes in for periodic recheck on medications and conditions including depression and anxiety.  Depression screen [facility] 2/9 04/20/2018 12/09/2017 09/16/2017 08/14/2017 05/28/2017  Decreased Interest 2 0 1 0 2  Down, Depressed, Hopeless 1 0 1 0 1  PHQ - 2 Score 3 0 2 0 3  Altered sleeping 2 0 0 0 2  Tired, decreased energy 2 0 1 0 1  Change in appetite 2 0 0 0 2  Feeling bad or failure about yourself  1 0 0 0 2  Trouble concentrating 1 0 2 0 2  Moving slowly or fidgety/restless 1 0 0 0 0  Suicidal thoughts 0 0 0 0 0  PHQ-9 Score 12 0 5 0 12  Difficult doing work/chores - Not difficult at all - Not difficult at all -   NEW narcotic contract is reviewed and signed at today's visit. There are no red flag on the drug registry.  All medications are reviewed today. There are no reports of any problems with the medications. All of the medical conditions are reviewed and updated.  Lab work is reviewed and will be ordered as medically necessary. There are no new problems reported with today's visit.'  History reviewed. No pertinent past medical history. Relevant past medical, surgical, family and social history reviewed and updated as indicated. Interim medical history since our last visit reviewed. Allergies and medications reviewed and updated. DATA REVIEWED: CHART IN EPIC  Family History reviewed for pertinent findings.  Review of Systems  Constitutional: Negative.   HENT: Negative.   Eyes: Negative.   Respiratory: Negative.   Gastrointestinal: Negative.   Genitourinary: Negative.     Allergies as of 04/20/2018   No Known Allergies     Medication List       Accurate as of April 20, 2018 10:43 AM. Always use your most recent med list.        ALPRAZolam 1 MG tablet Commonly known as:  XANAX TAKE 1 TABLET TWICE DAILY AS NEEDED FOR ANXIETY   citalopram 40 MG tablet Commonly known as:  CELEXA Take 1 tablet (40 mg total) by mouth daily.   desogestrel-ethinyl estradiol 0.15-0.02/0.01 MG (21/5) tablet Commonly known as:  KARIVA Take 1 tablet by mouth daily.          Objective:    BP 125/78   Pulse 87   Temp 98.1 F (36.7 C) (Oral)   Ht 5\' 3"  (1.6 m)   Wt 191 lb (86.6 kg)   BMI 33.83 kg/m   No Known Allergies  Wt Readings from Last 3 Encounters:  04/20/18 191 lb (86.6 kg)  09/16/17 165 lb (74.8 kg)  05/28/17 147 lb (66.7 kg)    Physical Exam Constitutional:      Appearance: She is well-developed.  HENT:     Head: Normocephalic and atraumatic.  Eyes:     Conjunctiva/sclera: Conjunctivae normal.     Pupils: Pupils are equal, round, and reactive to light.  Cardiovascular:     Rate and Rhythm: Normal rate and regular rhythm.  Heart sounds: Normal heart sounds.  Pulmonary:     Effort: Pulmonary effort is normal.     Breath sounds: Normal breath sounds.  Abdominal:     General: Bowel sounds are normal.     Palpations: Abdomen is soft.  Skin:    General: Skin is warm and dry.     Findings: No rash.  Neurological:     Mental Status: She is alert and oriented to person, place, and time.     Deep Tendon Reflexes: Reflexes are normal and symmetric.  Psychiatric:        Behavior: Behavior normal.        Thought Content: Thought content normal.        Judgment: Judgment normal.     Results for orders placed or performed in visit on 09/16/17  IGP, rfx Aptima HPV ASCU  Result Value Ref Range   DIAGNOSIS: Comment (A)    Specimen adequacy: Comment    Clinician Provided ICD10 Comment    Performed by: Comment    Electronically signed by: Comment    PAP Smear Comment .    PATHOLOGIST PROVIDED ICD10: Comment    Note:  Comment    Test Methodology Comment    PAP Reflex Comment       Assessment & Plan:   1. Depression, recurrent (HCC) - ALPRAZolam (XANAX) 1 MG tablet; TAKE 1 TABLET TWICE DAILY AS NEEDED FOR ANXIETY  Dispense: 60 tablet; Refill: 5 - citalopram (CELEXA) 40 MG tablet; Take 1 tablet (40 mg total) by mouth daily.  Dispense: 30 tablet; Refill: 11  2. Anxiety - ALPRAZolam (XANAX) 1 MG tablet; TAKE 1 TABLET TWICE DAILY AS NEEDED FOR ANXIETY  Dispense: 60 tablet; Refill: 5 - citalopram (CELEXA) 40 MG tablet; Take 1 tablet (40 mg total) by mouth daily.  Dispense: 30 tablet; Refill: 11   Continue all other maintenance medications as listed above.  Follow up plan: Return in about 6 months (around 10/20/2018).  Educational handout given for survey  Remus LofflerAngel S. Joseangel Nettleton PA-C [facility] [address]  [place], [place] [address] [phone]   04/20/2018, 10:43 AM

## 2018-06-08 ENCOUNTER — Other Ambulatory Visit: Payer: Self-pay | Admitting: Physician Assistant

## 2018-06-08 ENCOUNTER — Telehealth: Payer: Self-pay | Admitting: Physician Assistant

## 2018-06-08 MED ORDER — METRONIDAZOLE 500 MG PO TABS: 500.0000 mg | ORAL_TABLET | Freq: Three times a day (TID) | ORAL | 0 refills | Status: DC

## 2018-06-08 NOTE — Telephone Encounter (Signed)
sent 

## 2018-06-08 NOTE — Telephone Encounter (Signed)
Patient aware.

## 2018-06-16 ENCOUNTER — Other Ambulatory Visit: Payer: Self-pay | Admitting: Physician Assistant

## 2018-09-17 ENCOUNTER — Other Ambulatory Visit: Payer: Self-pay | Admitting: Physician Assistant

## 2018-09-17 DIAGNOSIS — F419 Anxiety disorder, unspecified: Secondary | ICD-10-CM

## 2018-09-17 DIAGNOSIS — F339 Major depressive disorder, recurrent, unspecified: Secondary | ICD-10-CM

## 2018-10-19 ENCOUNTER — Other Ambulatory Visit: Payer: Self-pay | Admitting: Physician Assistant

## 2018-10-19 DIAGNOSIS — F339 Major depressive disorder, recurrent, unspecified: Secondary | ICD-10-CM

## 2018-10-19 DIAGNOSIS — F419 Anxiety disorder, unspecified: Secondary | ICD-10-CM

## 2018-10-20 ENCOUNTER — Encounter: Payer: Self-pay | Admitting: Physician Assistant

## 2018-10-20 ENCOUNTER — Other Ambulatory Visit: Payer: Self-pay

## 2018-10-20 ENCOUNTER — Ambulatory Visit (INDEPENDENT_AMBULATORY_CARE_PROVIDER_SITE_OTHER): Payer: Self-pay | Admitting: Physician Assistant

## 2018-10-20 DIAGNOSIS — F339 Major depressive disorder, recurrent, unspecified: Secondary | ICD-10-CM

## 2018-10-20 DIAGNOSIS — Z01419 Encounter for gynecological examination (general) (routine) without abnormal findings: Secondary | ICD-10-CM

## 2018-10-20 DIAGNOSIS — F419 Anxiety disorder, unspecified: Secondary | ICD-10-CM

## 2018-10-20 MED ORDER — ALPRAZOLAM 1 MG PO TABS: ORAL_TABLET | ORAL | 5 refills | Status: DC

## 2018-10-20 MED ORDER — BUSPIRONE HCL 10 MG PO TABS: 10.0000 mg | ORAL_TABLET | Freq: Three times a day (TID) | ORAL | 5 refills | Status: DC

## 2018-10-20 MED ORDER — DESOGESTREL-ETHINYL ESTRADIOL 0.15-0.02/0.01 MG (21/5) PO TABS: 1.0000 | ORAL_TABLET | Freq: Every day | ORAL | 12 refills | Status: DC

## 2018-10-20 NOTE — Progress Notes (Signed)
BP 113/69   Pulse 80   Temp 98 F (36.7 C) (Oral)   Ht 5\' 3"  (1.6 m)   Wt 197 lb 9.6 oz (89.6 kg)   BMI 35.00 kg/m    Subjective:    Patient ID: Megan Wiley, female    DOB: [birth year], 28 y.o.   MRN: [id number]  HPI: Megan Wiley is a 28 y.o. female presenting on 10/20/2018 for Depression (6 month)  We are going to start the patient on BuSpar 10mg  1 up to 3 times a day for anxiety.  She will build up over the next couple weeks starting with 1 a day then 2 a day, then 3 a day.  I have let her know this does not work quickly but can be a smooth transition off for her persistent anxiety.  We are going to lower her alprazolam to half tablet twice a day.  We will lower the number prescribed.  She will continue her citalopram.  ANXIETY ASSESSMENT Cause of anxiety: GAD This patient returns for a  month recheck on narcotic use for the above named condition(s)  Current medications-citalopram 40 mg 1 daily Alprazolam 1 mg BID, Planning to lower to 1/2 tab BID Adding Buspar in the next month, titrating up Other medications tried: zoloft, prozac Medication side effects- no Any concerns- no Any change in general medical condition- no Effectiveness of current meds- good PMP AWARE website reviewed: Yes Any suspicious activity on PMP Aware: No LME daily dose: 3  Contract on file  04/21/18  History of overdose or risk of abuse no    History reviewed. No pertinent past medical history. Relevant past medical, surgical, family and social history reviewed and updated as indicated. Interim medical history since our last visit reviewed. Allergies and medications reviewed and updated. DATA REVIEWED: CHART IN EPIC  Family History reviewed for pertinent findings.  Review of Systems  Constitutional: Negative.   HENT: Negative.   Eyes: Negative.   Respiratory: Negative.   Gastrointestinal: Negative.   Genitourinary: Negative.     Allergies as of 10/20/2018   No Known Allergies    Medication List       Accurate as of October 20, 2018 10:55 AM. If you have any questions, ask your nurse or doctor.        STOP taking these medications   metroNIDAZOLE 500 MG tablet Commonly known as: FLAGYL Stopped by: Terald Sleeper, PA-C     TAKE these medications   ALPRAZolam 1 MG tablet Commonly known as: XANAX TAKE 1 TABLET TWICE DAILY AS NEEDED FOR ANXIETY   busPIRone 10 MG tablet Commonly known as: BUSPAR Take 1 tablet (10 mg total) by mouth 3 (three) times daily. Started by: Terald Sleeper, PA-C   citalopram 40 MG tablet Commonly known as: CELEXA Take 1 tablet (40 mg total) by mouth daily.   desogestrel-ethinyl estradiol 0.15-0.02/0.01 MG (21/5) tablet Commonly known as: Kariva Take 1 tablet by mouth daily.          Objective:    BP 113/69   Pulse 80   Temp 98 F (36.7 C) (Oral)   Ht 5\' 3"  (1.6 m)   Wt 197 lb 9.6 oz (89.6 kg)   BMI 35.00 kg/m   No Known Allergies  Wt Readings from Last 3 Encounters:  10/20/18 197 lb 9.6 oz (89.6 kg)  04/20/18 191 lb (86.6 kg)  09/16/17 165 lb (74.8 kg)    Physical Exam Constitutional:      Appearance:  She is well-developed.  HENT:     Head: Normocephalic and atraumatic.  Eyes:     Conjunctiva/sclera: Conjunctivae normal.     Pupils: Pupils are equal, round, and reactive to light.  Cardiovascular:     Rate and Rhythm: Normal rate and regular rhythm.     Heart sounds: Normal heart sounds.  Pulmonary:     Effort: Pulmonary effort is normal.  Skin:    General: Skin is warm and dry.     Findings: No rash.  Neurological:     Mental Status: She is alert and oriented to person, place, and time.     Deep Tendon Reflexes: Reflexes are normal and symmetric.         Assessment & Plan:   1. Depression, recurrent (HCC) - ALPRAZolam (XANAX) 1 MG tablet; TAKE 1 TABLET TWICE DAILY AS NEEDED FOR ANXIETY  Dispense: 30 tablet; Refill: 5  2. Anxiety - ALPRAZolam (XANAX) 1 MG tablet; TAKE 1 TABLET TWICE DAILY AS  NEEDED FOR ANXIETY  Dispense: 30 tablet; Refill: 5 - busPIRone (BUSPAR) 10 MG tablet; Take 1 tablet (10 mg total) by mouth 3 (three) times daily.  Dispense: 90 tablet; Refill: 5  3. Well female exam with routine gynecological exam - desogestrel-ethinyl estradiol (KARIVA) 0.15-0.02/0.01 MG (21/5) tablet; Take 1 tablet by mouth daily.  Dispense: 1 Package; Refill: 12   Continue all other maintenance medications as listed above.  Follow up plan: Return in about 6 months (around 04/21/2019) for recheck medications, follow up anxiety.  Educational handout given for survey  Remus LofflerAngel S. Sayda Grable PA-C [facility] [address]  [place], [place] [address] [phone]   10/20/2018, 10:55 AM

## 2019-02-17 ENCOUNTER — Other Ambulatory Visit: Payer: Self-pay

## 2019-02-17 DIAGNOSIS — Z20822 Contact with and (suspected) exposure to covid-19: Secondary | ICD-10-CM

## 2019-02-18 LAB — NOVEL CORONAVIRUS, NAA: SARS-CoV-2, NAA: NOT DETECTED

## 2019-04-16 ENCOUNTER — Other Ambulatory Visit: Payer: Self-pay

## 2019-04-19 ENCOUNTER — Encounter: Payer: Self-pay | Admitting: Physician Assistant

## 2019-04-19 ENCOUNTER — Ambulatory Visit (INDEPENDENT_AMBULATORY_CARE_PROVIDER_SITE_OTHER): Payer: Self-pay | Admitting: Physician Assistant

## 2019-04-19 ENCOUNTER — Other Ambulatory Visit: Payer: Self-pay

## 2019-04-19 VITALS — BP 112/70 | HR 75 | Temp 98.7°F | Ht 63.0 in | Wt 184.0 lb

## 2019-04-19 DIAGNOSIS — F339 Major depressive disorder, recurrent, unspecified: Secondary | ICD-10-CM

## 2019-04-19 DIAGNOSIS — F419 Anxiety disorder, unspecified: Secondary | ICD-10-CM

## 2019-04-19 DIAGNOSIS — Z79899 Other long term (current) drug therapy: Secondary | ICD-10-CM

## 2019-04-19 MED ORDER — CITALOPRAM HYDROBROMIDE 40 MG PO TABS: 40.0000 mg | ORAL_TABLET | Freq: Every day | ORAL | 11 refills | Status: DC

## 2019-04-19 MED ORDER — ALPRAZOLAM 1 MG PO TABS: ORAL_TABLET | ORAL | 5 refills | Status: DC

## 2019-04-19 NOTE — Addendum Note (Signed)
Addended by: Thana Ates on: 04/19/2019 10:45 AM   Modules accepted: Orders

## 2019-04-19 NOTE — Progress Notes (Signed)
BP 112/70   Pulse 75   Temp 98.7 F (37.1 C) (Temporal)   Ht 5\' 3"  (1.6 m)   Wt 184 lb (83.5 kg)   LMP 04/16/2019   SpO2 (!) 75%   BMI 32.59 kg/m    Subjective:    Patient ID: 14/03/2019, female    DOB: [birth year], 28 y.o.   MRN: [id number]  HPI: Megan Wiley is a 28 y.o. female presenting on 04/19/2019 for Depression (6 month follow up )  ANXIETY ASSESSMENT Cause of anxiety: GAD This patient returns for a  month recheck on narcotic use for the above named condition(s)  Current medications- alprazolam 1 mg up to BID celexa  40 mg one daily Marijuana 2 weeks ago, advising. Other medications tried: SSRI Medication side effects- no Any concerns- no Any change in general medical condition- no Effectiveness of current meds- good PMP AWARE website reviewed: Yes Any suspicious activity on PMP Aware: No LME daily dose: 4  Contract on file 04/19/19 Last UDS  04/19/19  History of overdose or risk of abuse no   Past Medical History:  Diagnosis Date  . Anxiety   . Depression    Relevant past medical, surgical, family and social history reviewed and updated as indicated. Interim medical history since our last visit reviewed. Allergies and medications reviewed and updated. DATA REVIEWED: CHART IN EPIC  Family History reviewed for pertinent findings.  Review of Systems  Constitutional: Negative.   HENT: Negative.   Eyes: Negative.   Respiratory: Negative.   Gastrointestinal: Negative.   Genitourinary: Negative.     Allergies as of 04/19/2019   No Known Allergies     Medication List       Accurate as of April 19, 2019  9:29 AM. If you have any questions, ask your nurse or doctor.        ALPRAZolam 1 MG tablet Commonly known as: XANAX TAKE 1 TABLET TWICE DAILY AS NEEDED FOR ANXIETY   busPIRone 10 MG tablet Commonly known as: BUSPAR Take 1 tablet (10 mg total) by mouth 3 (three) times daily.   citalopram 40 MG tablet Commonly known as:  CELEXA Take 1 tablet (40 mg total) by mouth daily.   desogestrel-ethinyl estradiol 0.15-0.02/0.01 MG (21/5) tablet Commonly known as: Kariva Take 1 tablet by mouth daily.          Objective:    BP 112/70   Pulse 75   Temp 98.7 F (37.1 C) (Temporal)   Ht 5\' 3"  (1.6 m)   Wt 184 lb (83.5 kg)   LMP 04/16/2019   SpO2 (!) 75%   BMI 32.59 kg/m   No Known Allergies  Wt Readings from Last 3 Encounters:  04/19/19 184 lb (83.5 kg)  10/20/18 197 lb 9.6 oz (89.6 kg)  04/20/18 191 lb (86.6 kg)    Physical Exam Constitutional:      General: She is not in acute distress.    Appearance: Normal appearance. She is well-developed.  HENT:     Head: Normocephalic and atraumatic.  Cardiovascular:     Rate and Rhythm: Normal rate.  Pulmonary:     Effort: Pulmonary effort is normal.  Skin:    General: Skin is warm and dry.     Findings: No rash.  Neurological:     Mental Status: She is alert and oriented to person, place, and time.     Deep Tendon Reflexes: Reflexes are normal and symmetric.     Results for orders placed or  performed in visit on 02/17/19  Novel Coronavirus, NAA ([facility])   Specimen: Nasopharyngeal(NP) swabs in vial transport medium   NASOPHARYNGE  TESTING  Result Value Ref Range   SARS-CoV-2, NAA Not Detected Not Detected      Assessment & Plan:   1. Depression, recurrent (HCC) - ALPRAZolam (XANAX) 1 MG tablet; TAKE 1 TABLET TWICE DAILY AS NEEDED FOR ANXIETY  Dispense: 60 tablet; Refill: 5 - citalopram (CELEXA) 40 MG tablet; Take 1 tablet (40 mg total) by mouth daily.  Dispense: 30 tablet; Refill: 11  2. Anxiety - ALPRAZolam (XANAX) 1 MG tablet; TAKE 1 TABLET TWICE DAILY AS NEEDED FOR ANXIETY  Dispense: 60 tablet; Refill: 5 - citalopram (CELEXA) 40 MG tablet; Take 1 tablet (40 mg total) by mouth daily.  Dispense: 30 tablet; Refill: 11   Continue all other maintenance medications as listed above.  Follow up plan: No follow-ups on file.  Educational  handout given for stress   Terald Sleeper PA-C [facility] [address]  [place], [place] [address] [phone]   04/19/2019, 9:29 AM

## 2019-04-19 NOTE — Patient Instructions (Signed)

## 2019-04-21 ENCOUNTER — Ambulatory Visit: Payer: Self-pay | Admitting: Physician Assistant

## 2019-04-21 LAB — TOXASSURE SELECT 13 (MW), URINE

## 2019-05-10 ENCOUNTER — Other Ambulatory Visit: Payer: Self-pay | Admitting: Physician Assistant

## 2019-05-10 DIAGNOSIS — F339 Major depressive disorder, recurrent, unspecified: Secondary | ICD-10-CM

## 2019-05-10 DIAGNOSIS — F419 Anxiety disorder, unspecified: Secondary | ICD-10-CM

## 2019-05-10 MED ORDER — ALPRAZOLAM 1 MG PO TABS: ORAL_TABLET | ORAL | 0 refills | Status: DC

## 2019-10-15 ENCOUNTER — Ambulatory Visit: Payer: Self-pay | Admitting: Family

## 2019-10-15 ENCOUNTER — Encounter: Payer: Self-pay | Admitting: Family

## 2019-10-15 ENCOUNTER — Other Ambulatory Visit: Payer: Self-pay

## 2019-10-15 ENCOUNTER — Ambulatory Visit: Payer: Self-pay | Admitting: Physician Assistant

## 2019-10-15 VITALS — BP 122/80 | HR 53 | Temp 98.2°F | Ht 63.0 in | Wt 181.8 lb

## 2019-10-15 DIAGNOSIS — Z3041 Encounter for surveillance of contraceptive pills: Secondary | ICD-10-CM

## 2019-10-15 DIAGNOSIS — F339 Major depressive disorder, recurrent, unspecified: Secondary | ICD-10-CM

## 2019-10-15 DIAGNOSIS — F419 Anxiety disorder, unspecified: Secondary | ICD-10-CM

## 2019-10-15 MED ORDER — CITALOPRAM HYDROBROMIDE 40 MG PO TABS: 40.0000 mg | ORAL_TABLET | Freq: Every day | ORAL | 3 refills | Status: DC

## 2019-10-15 MED ORDER — BUSPIRONE HCL 15 MG PO TABS: 15.0000 mg | ORAL_TABLET | Freq: Three times a day (TID) | ORAL | 2 refills | Status: DC

## 2019-10-15 MED ORDER — NORGESTIMATE-ETH ESTRADIOL 0.25-35 MG-MCG PO TABS: 1.0000 | ORAL_TABLET | Freq: Every day | ORAL | 4 refills | Status: AC

## 2019-10-15 NOTE — Patient Instructions (Signed)
Generalized Anxiety Disorder, Adult Generalized anxiety disorder (GAD) is a mental health disorder. People with this condition constantly worry about everyday events. Unlike normal anxiety, worry related to GAD is not triggered by a specific event. These worries also do not fade or get better with time. GAD interferes with life functions, including relationships, work, and school. GAD can vary from mild to severe. People with severe GAD can have intense waves of anxiety with physical symptoms (panic attacks). What are the causes? The exact cause of GAD is not known. What increases the risk? This condition is more likely to develop in:  Women.  People who have a family history of anxiety disorders.  People who are very shy.  People who experience very stressful life events, such as the death of a loved one.  People who have a very stressful family environment. What are the signs or symptoms? People with GAD often worry excessively about many things in their lives, such as their health and family. They may also be overly concerned about:  Doing well at work.  Being on time.  Natural disasters.  Friendships. Physical symptoms of GAD include:  Fatigue.  Muscle tension or having muscle twitches.  Trembling or feeling shaky.  Being easily startled.  Feeling like your heart is pounding or racing.  Feeling out of breath or like you cannot take a deep breath.  Having trouble falling asleep or staying asleep.  Sweating.  Nausea, diarrhea, or irritable bowel syndrome (IBS).  Headaches.  Trouble concentrating or remembering facts.  Restlessness.  Irritability. How is this diagnosed? Your health care provider can diagnose GAD based on your symptoms and medical history. You will also have a physical exam. The health care provider will ask specific questions about your symptoms, including how severe they are, when they started, and if they come and go. Your health care  provider may ask you about your use of alcohol or drugs, including prescription medicines. Your health care provider may refer you to a mental health specialist for further evaluation. Your health care provider will do a thorough examination and may perform additional tests to rule out other possible causes of your symptoms. To be diagnosed with GAD, a person must have anxiety that:  Is out of his or her control.  Affects several different aspects of his or her life, such as work and relationships.  Causes distress that makes him or her unable to take part in normal activities.  Includes at least three physical symptoms of GAD, such as restlessness, fatigue, trouble concentrating, irritability, muscle tension, or sleep problems. Before your health care provider can confirm a diagnosis of GAD, these symptoms must be present more days than they are not, and they must last for six months or longer. How is this treated? The following therapies are usually used to treat GAD:  Medicine. Antidepressant medicine is usually prescribed for long-term daily control. Antianxiety medicines may be added in severe cases, especially when panic attacks occur.  Talk therapy (psychotherapy). Certain types of talk therapy can be helpful in treating GAD by providing support, education, and guidance. Options include: ? Cognitive behavioral therapy (CBT). People learn coping skills and techniques to ease their anxiety. They learn to identify unrealistic or negative thoughts and behaviors and to replace them with positive ones. ? Acceptance and commitment therapy (ACT). This treatment teaches people how to be mindful as a way to cope with unwanted thoughts and feelings. ? Biofeedback. This process trains you to manage your body's response (  physiological response) through breathing techniques and relaxation methods. You will work with a therapist while machines are used to monitor your physical symptoms.  Stress  management techniques. These include yoga, meditation, and exercise. A mental health specialist can help determine which treatment is best for you. Some people see improvement with one type of therapy. However, other people require a combination of therapies. Follow these instructions at home:  Take over-the-counter and prescription medicines only as told by your health care provider.  Try to maintain a normal routine.  Try to anticipate stressful situations and allow extra time to manage them.  Practice any stress management or self-calming techniques as taught by your health care provider.  Do not punish yourself for setbacks or for not making progress.  Try to recognize your accomplishments, even if they are small.  Keep all follow-up visits as told by your health care provider. This is important. Contact a health care provider if:  Your symptoms do not get better.  Your symptoms get worse.  You have signs of depression, such as: ? A persistently sad, cranky, or irritable mood. ? Loss of enjoyment in activities that used to bring you joy. ? Change in weight or eating. ? Changes in sleeping habits. ? Avoiding friends or family members. ? Loss of energy for normal tasks. ? Feelings of guilt or worthlessness. Get help right away if:  You have serious thoughts about hurting yourself or others. If you ever feel like you may hurt yourself or others, or have thoughts about taking your own life, get help right away. You can go to your nearest emergency department or call:  Your local emergency services (911 in the [place]  A suicide crisis helpline, such as the National Suicide Prevention Lifeline at 1-800-273-8255. This is open 24 hours a day. Summary  Generalized anxiety disorder (GAD) is a mental health disorder that involves worry that is not triggered by a specific event.  People with GAD often worry excessively about many things in their lives, such as their health and  family.  GAD may cause physical symptoms such as restlessness, trouble concentrating, sleep problems, frequent sweating, nausea, diarrhea, headaches, and trembling or muscle twitching.  A mental health specialist can help determine which treatment is best for you. Some people see improvement with one type of therapy. However, other people require a combination of therapies. This information is not intended to replace advice given to you by your health care provider. Make sure you discuss any questions you have with your health care provider. Document Revised: 04/04/2017 Document Reviewed: 03/12/2016 Elsevier Patient Education  2020 Elsevier Inc.  Your provider wants you to schedule an appointment with a Psychologist/Psychiatrist. The following list of offices requires the patient to call and make their own appointment, as there is information they need that only you can provide. Please feel free to choose form the following providers:  [facility]   [phone] Crisis Recovery in [place] [phone]  [facility]  [phone]   [address] [place], [place]  (Scheduled through [facility]) Must call and do an interview for appointment. Sees Children / Accepts Medicaid  [facility]    [phone]  [address]    [place], [place]       [facility]  [phone] [address] [place], [place]  Evaluates for Autism but does not treat it Sees Children / Accepts Medicaid  [facility]    [phone] [address], [address]   [place], [place] Medication management, substance abuse, bipolar, grief, family, marriage, OCD, anxiety, PTSD Sees children / Accepts Medicaid  [facility]    [phone] [address] [place], [place] Sees children / Accepts Medicaid  [facility]  [phone] [address] [place], [place]   Dr Akinlayo     [phone] [place], [place]  Sees ADD & ADHD for treatment Accepts Medicaid  [facility]  [phone] [address] [place], [place] Evaluates for Autism Accepts Medicaid  [facility]  [phone] [address]  [place], [place]  Does Adult ADD evaluations Does not accept Medicaid  [facility]   [phone] [address]   [place],  Uses animal therapy  Sees children as young as 3 years old Accepts Medicaid  [facility]     [phone]    [address]  [place],  [address] Sees children Accepts Medicaid    

## 2019-10-15 NOTE — Progress Notes (Signed)
Subjective:    Patient ID: Megan Wiley, female    DOB: Oct 18, 1990, 29 y.o.   MRN: [id number]  Chief Complaint  Patient presents with  . Depression    6 MTH FOLLOW UP FROM JONES   Pt presents to the office today to establish care with me. She reports she has not taken any medications over the last 4 months.  Depression        This is a chronic problem.  The current episode started more than 1 year ago.   The onset quality is gradual.   The problem occurs intermittently.  The problem has been waxing and waning since onset.  Associated symptoms include fatigue, helplessness, irritable, restlessness, decreased interest and sad.  Past treatments include nothing.  Compliance with treatment is good.  Past medical history includes anxiety.   Anxiety Presents for follow-up visit. Symptoms include depressed mood, irritability, nausea, nervous/anxious behavior and restlessness. Symptoms occur constantly. The severity of symptoms is moderate. The quality of sleep is good.        Review of Systems  Constitutional: Positive for fatigue and irritability.  Gastrointestinal: Positive for nausea.  Psychiatric/Behavioral: Positive for depression. The patient is nervous/anxious.   All other systems reviewed and are negative.      Objective:   Physical Exam Vitals reviewed.  Constitutional:      General: She is irritable. She is not in acute distress.    Appearance: She is well-developed.  HENT:     Head: Normocephalic and atraumatic.     Right Ear: Tympanic membrane normal.     Left Ear: Tympanic membrane normal.  Eyes:     Pupils: Pupils are equal, round, and reactive to light.  Neck:     Thyroid: No thyromegaly.  Cardiovascular:     Rate and Rhythm: Normal rate and regular rhythm.     Heart sounds: Normal heart sounds. No murmur heard.   Pulmonary:     Effort: Pulmonary effort is normal. No respiratory distress.     Breath sounds: Normal breath sounds. No wheezing.  Abdominal:      General: Bowel sounds are normal. There is no distension.     Palpations: Abdomen is soft.     Tenderness: There is no abdominal tenderness.  Musculoskeletal:        General: No tenderness. Normal range of motion.     Cervical back: Normal range of motion and neck supple.  Skin:    General: Skin is warm and dry.  Neurological:     Mental Status: She is alert and oriented to person, place, and time.     Cranial Nerves: No cranial nerve deficit.     Deep Tendon Reflexes: Reflexes are normal and symmetric.  Psychiatric:        Mood and Affect: Mood is anxious. Affect is tearful.        Behavior: Behavior normal.        Thought Content: Thought content normal.        Judgment: Judgment normal.     BP 122/80   Pulse (!) 53   Temp 98.2 F (36.8 C) (Temporal)   Ht 5\' 3"  (1.6 m)   Wt 181 lb 12.8 oz (82.5 kg)   LMP 09/24/2019 (Approximate)   SpO2 96%   BMI 32.20 kg/m        Assessment & Plan:  Megan Wiley comes in today with chief complaint of Depression (6 MTH FOLLOW UP FROM JONES)   Diagnosis and orders addressed:  1. Depression, recurrent (HCC) - citalopram (CELEXA) 40 MG tablet; Take 1 tablet (40 mg total) by mouth daily.  Dispense: 90 tablet; Refill: 3 - busPIRone (BUSPAR) 15 MG tablet; Take 1 tablet (15 mg total) by mouth 3 (three) times daily.  Dispense: 90 tablet; Refill: 2 - Ambulatory referral to Psychiatry  2. Anxiety - citalopram (CELEXA) 40 MG tablet; Take 1 tablet (40 mg total) by mouth daily.  Dispense: 90 tablet; Refill: 3 - busPIRone (BUSPAR) 15 MG tablet; Take 1 tablet (15 mg total) by mouth 3 (three) times daily.  Dispense: 90 tablet; Refill: 2 - Ambulatory referral to Psychiatry  3. Encounter for surveillance of contraceptive pills - norgestimate-ethinyl estradiol ([place] 28) 0.25-35 MG-MCG tablet; Take 1 tablet by mouth daily.  Dispense: 90 tablet; Refill: 4  Will restart Celexa today, pt will take 20 mg for 14 days then increase to 40 mg. Will  restart Busapr but 15 mg TID.  Long discussion with patient that we could not restart her Xanax because of failed drug screen. Pt very tearful today. I have placed referral to [facility]. Pt is self pay.  Labs pending Health Maintenance reviewed Diet and exercise encouraged  Follow up plan: 6 months    Evelina Dun, FNP

## 2020-01-24 ENCOUNTER — Telehealth (INDEPENDENT_AMBULATORY_CARE_PROVIDER_SITE_OTHER): Payer: Self-pay | Admitting: Family Medicine

## 2020-01-24 ENCOUNTER — Encounter: Payer: Self-pay | Admitting: Family Medicine

## 2020-01-24 DIAGNOSIS — N760 Acute vaginitis: Secondary | ICD-10-CM

## 2020-01-24 DIAGNOSIS — B9689 Other specified bacterial agents as the cause of diseases classified elsewhere: Secondary | ICD-10-CM

## 2020-01-24 MED ORDER — METRONIDAZOLE 500 MG PO TABS: 500.0000 mg | ORAL_TABLET | Freq: Two times a day (BID) | ORAL | 0 refills | Status: DC

## 2020-01-24 NOTE — Progress Notes (Signed)
   Virtual Visit via Telephone Note  I connected with Megan Wiley on 01/24/20 at 4:16 PM by telephone and verified that I am speaking with the correct person using two identifiers. Megan Wiley is currently located at home and her kids are currently with her during this visit. The provider, Gwenlyn Fudge, FNP is located in their office at time of visit.  I discussed the limitations, risks, security and privacy concerns of performing an evaluation and management service by telephone and the availability of in person appointments. I also discussed with the patient that there may be a patient responsible charge related to this service. The patient expressed understanding and agreed to proceed.  Subjective: PCP: Junie Spencer, FNP  Chief Complaint  Patient presents with  . BV   Patient reports a history of bacterial vaginosis and states she is having the same symptoms again.  She reports she is very irritated in her vaginal area and is having a white thin discharge.  Her symptoms have been going on for 4 days.  She was unable to come into the office today she is currently at home with her children in quarantine because of COVID-19.   ROS: Per HPI  Current Outpatient Medications:  .  busPIRone (BUSPAR) 15 MG tablet, Take 1 tablet (15 mg total) by mouth 3 (three) times daily., Disp: 90 tablet, Rfl: 2 .  citalopram (CELEXA) 40 MG tablet, Take 1 tablet (40 mg total) by mouth daily., Disp: 90 tablet, Rfl: 3 .  norgestimate-ethinyl estradiol (SPRINTEC 28) 0.25-35 MG-MCG tablet, Take 1 tablet by mouth daily., Disp: 90 tablet, Rfl: 4  No Known Allergies Past Medical History:  Diagnosis Date  . Anxiety   . Depression     Observations/Objective: A&O  No respiratory distress or wheezing audible over the phone Mood, judgement, and thought processes all WNL   Assessment and Plan: 1. Bacterial vaginosis - metroNIDAZOLE (FLAGYL) 500 MG tablet; Take 1 tablet (500 mg total) by mouth 2  (two) times daily.  Dispense: 14 tablet; Refill: 0   Follow Up Instructions:  I discussed the assessment and treatment plan with the patient. The patient was provided an opportunity to ask questions and all were answered. The patient agreed with the plan and demonstrated an understanding of the instructions.   The patient was advised to call back or seek an in-person evaluation if the symptoms worsen or if the condition fails to improve as anticipated.  The above assessment and management plan was discussed with the patient. The patient verbalized understanding of and has agreed to the management plan. Patient is aware to call the clinic if symptoms persist or worsen. Patient is aware when to return to the clinic for a follow-up visit. Patient educated on when it is appropriate to go to the emergency department.   Time call ended: 4:20 PM  I provided 6 minutes of non-face-to-face time during this encounter.  Deliah Boston, MSN, APRN, FNP-C [facility] 01/24/20

## 2020-06-21 ENCOUNTER — Other Ambulatory Visit: Payer: Self-pay

## 2020-06-21 ENCOUNTER — Ambulatory Visit
Admission: EM | Admit: 2020-06-21 | Discharge: 2020-06-21 | Disposition: A | Discharge status: Home / Self Care | Payer: Self-pay | Attending: Emergency Medicine | Admitting: Emergency Medicine

## 2020-06-21 ENCOUNTER — Encounter: Payer: Self-pay | Admitting: Emergency Medicine

## 2020-06-21 DIAGNOSIS — L239 Allergic contact dermatitis, unspecified cause: Secondary | ICD-10-CM

## 2020-06-21 MED ORDER — PREDNISONE 10 MG PO TABS: 20.0000 mg | ORAL_TABLET | Freq: Every day | ORAL | 0 refills | Status: DC

## 2020-06-21 MED ORDER — CETIRIZINE HCL 10 MG PO TABS: 10.0000 mg | ORAL_TABLET | Freq: Every day | ORAL | 0 refills | Status: AC

## 2020-06-21 MED ORDER — DEXAMETHASONE SODIUM PHOSPHATE 10 MG/ML IJ SOLN
10.0000 mg | Freq: Once | INTRAMUSCULAR | Status: AC
  Administered 2020-06-21: 10 mg via INTRAMUSCULAR

## 2020-06-21 NOTE — Discharge Instructions (Addendum)
Prescribed zyrtec and prednisone  take as prescribed and to completion Limit hot shower and baths, or bathe with warm water.   Moisturize skin daily Follow up with PCP if symptoms persists Return or go to the ER if you have any new or worsening symptoms 

## 2020-06-21 NOTE — ED Triage Notes (Signed)
Rash itching and burning and stinging all over her body.  States she has tried benadryl cortisone cream and oatmeal bath with no relief.  States she changed her laundry detergent last week and not sure if this is the cause.  Rash appeared last Tuesday.

## 2020-06-21 NOTE — ED Provider Notes (Signed)
[facility]   [id number] 06/21/20 Arrival Time: 1410   No chief complaint on file.    SUBJECTIVE: History from: patient.  Elynn Patteson is a 30 y.o. female who presented to the urgent care with a complaint of rash for the past 2 days.  Denies any precipitating event but report she has change her laundry detergent.  She localizes the rash to her generalized body.  She describes it as red, itchy and burning.  She has tried OTC Benadryl, hydrocortisone cream, oatmeal bath without relief.  Denies alleviating or aggravating factors.denies similar symptoms in the past.  Denies chills, fever, nausea, vomiting, diarrhea.  ROS: As per HPI.  All other pertinent ROS negative.     Past Medical History:  Diagnosis Date  . Anxiety   . Depression    Past Surgical History:  Procedure Laterality Date  . CESAREAN SECTION    . NO PAST SURGERIES    . TONSILLECTOMY     No Known Allergies No current facility-administered medications on file prior to encounter.   Current Outpatient Medications on File Prior to Encounter  Medication Sig Dispense Refill  . busPIRone (BUSPAR) 15 MG tablet Take 1 tablet (15 mg total) by mouth 3 (three) times daily. 90 tablet 2  . citalopram (CELEXA) 40 MG tablet Take 1 tablet (40 mg total) by mouth daily. 90 tablet 3  . metroNIDAZOLE (FLAGYL) 500 MG tablet Take 1 tablet (500 mg total) by mouth 2 (two) times daily. 14 tablet 0  . norgestimate-ethinyl estradiol (SPRINTEC 28) 0.25-35 MG-MCG tablet Take 1 tablet by mouth daily. 90 tablet 4   Social History   Socioeconomic History  . Marital status: Single    Spouse name: Not on file  . Number of children: Not on file  . Years of education: Not on file  . Highest education level: Not on file  Occupational History  . Not on file  Tobacco Use  . Smoking status: Never Smoker  . Smokeless tobacco: Never Used  Vaping Use  . Vaping Use: Never used  Substance and Sexual Activity  . Alcohol use: Yes     Comment: occ  . Drug use: No  . Sexual activity: Yes    Birth control/protection: Implant  Other Topics Concern  . Not on file  Social History Narrative  . Not on file   Social Determinants of Health   Financial Resource Strain: Not on file  Food Insecurity: Not on file  Transportation Needs: Not on file  Physical Activity: Not on file  Stress: Not on file  Social Connections: Not on file  Intimate Partner Violence: Not on file   Family History  Problem Relation Age of Onset  . Diabetes Other   . Hypertension Other   . Hepatitis C Father   . Cirrhosis Father     OBJECTIVE:  Vitals:   06/21/20 1423  BP: 114/79  Pulse: 95  Resp: 18  Temp: 98 F (36.7 C)  TempSrc: Oral  SpO2: 98%     Physical Exam Vitals and nursing note reviewed.  Constitutional:      General: She is not in acute distress.    Appearance: Normal appearance. She is normal weight. She is not ill-appearing, toxic-appearing or diaphoretic.  HENT:     Head: Normocephalic.  Cardiovascular:     Rate and Rhythm: Normal rate and regular rhythm.     Pulses: Normal pulses.     Heart sounds: Normal heart sounds. No murmur heard. No friction rub.  No gallop.   Pulmonary:     Effort: Pulmonary effort is normal. No respiratory distress.     Breath sounds: Normal breath sounds. No stridor. No wheezing, rhonchi or rales.  Chest:     Chest wall: No tenderness.  Skin:    General: Skin is warm.     Findings: Rash present. Rash is macular.  Neurological:     Mental Status: She is alert and oriented to person, place, and time.     LABS:  No results found for this or any previous visit (from the past 24 hour(s)).   ASSESSMENT & PLAN:  1. Allergic contact dermatitis, unspecified trigger     Meds ordered this encounter  Medications  . cetirizine (ZYRTEC ALLERGY) 10 MG tablet    Sig: Take 1 tablet (10 mg total) by mouth daily.    Dispense:  30 tablet    Refill:  0  . predniSONE (DELTASONE) 10 MG  tablet    Sig: Take 2 tablets (20 mg total) by mouth daily.    Dispense:  15 tablet    Refill:  0    Discharge instructions  Prescribed zyrtec and prednisone  take as prescribed and to completion Limit hot shower and baths, or bathe with warm water.   Moisturize skin daily Follow up with PCP if symptoms persists Return or go to the ER if you have any new or worsening symptoms  Reviewed expectations re: course of current medical issues. Questions answered. Outlined signs and symptoms indicating need for more acute intervention. Patient verbalized understanding. After Visit Summary given.         Durward Parcel, FNP 06/21/20 1444

## 2020-11-01 ENCOUNTER — Encounter: Payer: Self-pay | Admitting: Family Medicine

## 2022-07-24 ENCOUNTER — Telehealth: Payer: Self-pay

## 2022-07-24 NOTE — Transitions of Care (Post Inpatient/ED Visit) (Signed)
   07/24/2022  Name: Megan Wiley MRN: [id number] DOB: Jun 16, 1990  Today's TOC FU Call Status: Today's TOC FU Call Status:: Unsuccessul Call (1st Attempt) Unsuccessful Call (1st Attempt) Date: 07/24/22  Attempted to reach the patient regarding the most recent Inpatient/ED visit.  Follow Up Plan: Additional outreach attempts will be made to reach the patient to complete the Transitions of Care (Post Inpatient/ED visit) call.   Signature Juanda Crumble, [place] Direct Dial [phone]

## 2022-07-25 NOTE — Transitions of Care (Post Inpatient/ED Visit) (Signed)
   07/25/2022  Name: Megan Wiley MRN: [id number] DOB: [birth year]  Today's TOC FU Call Status: Today's TOC FU Call Status:: Unsuccessful Call (2nd Attempt) Unsuccessful Call (1st Attempt) Date: 07/24/22 Unsuccessful Call (2nd Attempt) Date: 07/25/22  Attempted to reach the patient regarding the most recent Inpatient/ED visit.  Follow Up Plan: Additional outreach attempts will be made to reach the patient to complete the Transitions of Care (Post Inpatient/ED visit) call.   Signature Juanda Crumble, [place] Direct Dial [phone]

## 2022-07-26 NOTE — Transitions of Care (Post Inpatient/ED Visit) (Signed)
   07/26/2022  Name: Megan Wiley MRN: [id number] DOB: 19-May-1990  Today's TOC FU Call Status: Today's TOC FU Call Status:: Unsuccessful Call (3rd Attempt) Unsuccessful Call (1st Attempt) Date: 07/24/22 Unsuccessful Call (2nd Attempt) Date: 07/25/22 Unsuccessful Call (3rd Attempt) Date: 07/26/22  Attempted to reach the patient regarding the most recent Inpatient/ED visit.  Follow Up Plan: No further outreach attempts will be made at this time. We have been unable to contact the patient.  Signature Juanda Crumble, [place] Direct Dial [phone]

## 2022-10-31 ENCOUNTER — Telehealth: Payer: Self-pay

## 2022-10-31 NOTE — Transitions of Care (Post Inpatient/ED Visit) (Signed)
   10/31/2022  Name: Gigi Onstad MRN: [id number] DOB: 12-29-1990  Today's TOC FU Call Status: Today's TOC FU Call Status:: Unsuccessul Call (1st Attempt) Unsuccessful Call (1st Attempt) Date: 10/31/22  Attempted to reach the patient regarding the most recent Inpatient/ED visit.  Follow Up Plan: Additional outreach attempts will be made to reach the patient to complete the Transitions of Care (Post Inpatient/ED visit) call.   Signature Karena Addison, LPN [facility] Nurse Health Advisor Direct Dial [phone]

## 2022-11-05 NOTE — Transitions of Care (Post Inpatient/ED Visit) (Unsigned)
   11/05/2022  Name: Megan Wiley MRN: [id number] DOB: 1991-02-14  Today's TOC FU Call Status: Today's TOC FU Call Status:: Unsuccessful Call (2nd Attempt) Unsuccessful Call (1st Attempt) Date: 10/31/22 Unsuccessful Call (2nd Attempt) Date: 11/05/22  Attempted to reach the patient regarding the most recent Inpatient/ED visit.  Follow Up Plan: Additional outreach attempts will be made to reach the patient to complete the Transitions of Care (Post Inpatient/ED visit) call.   Signature  Karena Addison, LPN [facility] Nurse Health Advisor Direct Dial [phone]

## 2022-11-06 NOTE — Transitions of Care (Post Inpatient/ED Visit) (Signed)
   11/06/2022  Name: Dyane Szarka MRN: [id number] DOB: 1991-02-17  Today's TOC FU Call Status: Today's TOC FU Call Status:: Unsuccessful Call (3rd Attempt) Unsuccessful Call (1st Attempt) Date: 10/31/22 Unsuccessful Call (2nd Attempt) Date: 11/05/22 Unsuccessful Call (3rd Attempt) Date: 11/06/22  Attempted to reach the patient regarding the most recent Inpatient/ED visit.  Follow Up Plan: No further outreach attempts will be made at this time. We have been unable to contact the patient.  Signature Karena Addison, LPN [facility] Nurse Health Advisor Direct Dial [phone]

## 2022-11-26 ENCOUNTER — Ambulatory Visit
Admission: EM | Admit: 2022-11-26 | Discharge: 2022-11-26 | Disposition: A | Discharge status: Home / Self Care | Payer: Medicaid Other | Attending: Nurse Practitioner | Admitting: Nurse Practitioner

## 2022-11-26 DIAGNOSIS — S61211A Laceration without foreign body of left index finger without damage to nail, initial encounter: Secondary | ICD-10-CM | POA: Diagnosis not present

## 2022-11-26 NOTE — ED Provider Notes (Signed)
[facility]    CSN: [id number] Arrival date & time: 11/26/22  1352      History   Chief Complaint Chief Complaint  Patient presents with   Laceration    HPI Megan Wiley is a 32 y.o. female.   Patient presents today for laceration to left pointer finger that she sustained last night around 5 to 6 PM when she was cleaning chicken.  Reports she accidentally  cut the top of her finger with a knife.  Reports she immediately cleaned the wound with peroxide and kept it covered.  She endorses no current bleeding, however it is painful and throbbing.  Reports she did go to work today, works Education officer, environmental houses.  No fevers or nausea/vomiting since it happened.  No numbness or tingling in the fingertip.  Last Tdap 2022 when she got bit by a squirrel.    Past Medical History:  Diagnosis Date   Anxiety    Depression     Patient Active Problem List   Diagnosis Date Noted   Encounter for surveillance of contraceptive pills 05/30/2017   Depression, recurrent (HCC) 08/27/2016   Anxiety 08/27/2016    Past Surgical History:  Procedure Laterality Date   CESAREAN SECTION     NO PAST SURGERIES     TONSILLECTOMY      OB History     Gravida  2   Para  1   Term  1   Preterm  0   AB  0   Living  1      SAB  0   IAB  0   Ectopic  0   Multiple  0   Live Births               Home Medications    Prior to Admission medications   Medication Sig Start Date End Date Taking? Authorizing Provider  busPIRone (BUSPAR) 15 MG tablet Take 1 tablet (15 mg total) by mouth 3 (three) times daily. 10/15/19   Junie Spencer, FNP  cetirizine (ZYRTEC ALLERGY) 10 MG tablet Take 1 tablet (10 mg total) by mouth daily. 06/21/20   Avegno, Zachery Dakins, FNP  citalopram (CELEXA) 40 MG tablet Take 1 tablet (40 mg total) by mouth daily. 10/15/19   Junie Spencer, FNP  metroNIDAZOLE (FLAGYL) 500 MG tablet Take 1 tablet (500 mg total) by mouth 2 (two) times daily. 01/24/20   Gwenlyn Fudge, FNP  norgestimate-ethinyl estradiol (SPRINTEC 28) 0.25-35 MG-MCG tablet Take 1 tablet by mouth daily. 10/15/19   Junie Spencer, FNP  predniSONE (DELTASONE) 10 MG tablet Take 2 tablets (20 mg total) by mouth daily. 06/21/20   Avegno, Zachery Dakins, FNP    Family History Family History  Problem Relation Age of Onset   Diabetes Other    Hypertension Other    Hepatitis C Father    Cirrhosis Father     Social History Social History   Tobacco Use   Smoking status: Never   Smokeless tobacco: Never  Vaping Use   Vaping status: Never Used  Substance Use Topics   Alcohol use: Yes    Comment: occ   Drug use: No     Allergies   Patient has no known allergies.   Review of Systems Review of Systems Per HPI  Physical Exam Triage Vital Signs ED Triage Vitals  Encounter Vitals Group     BP 11/26/22 1428 125/82     Systolic BP Percentile --  Diastolic BP Percentile --      Pulse Rate 11/26/22 1427 84     Resp 11/26/22 1427 16     Temp 11/26/22 1427 98.9 F (37.2 C)     Temp Source 11/26/22 1427 Oral     SpO2 11/26/22 1427 98 %     Weight --      Height --      Head Circumference --      Peak Flow --      Pain Score 11/26/22 1428 5     Pain Loc --      Pain Education --      Exclude from Growth Chart --    No data found.  Updated Vital Signs BP 125/82 (BP Location: Right Arm)   Pulse 84   Temp 98.9 F (37.2 C) (Oral)   Resp 16   LMP 11/21/2022 (Exact Date)   SpO2 98%   Visual Acuity Right Eye Distance:   Left Eye Distance:   Bilateral Distance:    Right Eye Near:   Left Eye Near:    Bilateral Near:     Physical Exam Vitals and nursing note reviewed.  Constitutional:      General: She is not in acute distress.    Appearance: Normal appearance. She is not toxic-appearing.  HENT:     Mouth/Throat:     Mouth: Mucous membranes are moist.     Pharynx: Oropharynx is clear.  Pulmonary:     Effort: Pulmonary effort is normal. No respiratory  distress.  Skin:    General: Skin is warm and dry.     Capillary Refill: Capillary refill takes less than 2 seconds.     Findings: Laceration present.     Comments: Avulsed, oval shaped laceration to distal tip of left second digit.  Patient is neurovascularly intact distal to the laceration.  There is a skin flap over 90% of the wound bed; 10% of the wound bed is open to air.  No surrounding erythema or active drainage.  Neurological:     Mental Status: She is alert and oriented to person, place, and time.  Psychiatric:        Behavior: Behavior is cooperative.      UC Treatments / Results  Labs (all labs ordered are listed, but only abnormal results are displayed) Labs Reviewed - No data to display  EKG   Radiology No results found.  Procedures Laceration Repair  Date/Time: 11/26/2022 3:56 PM  Performed by: Valentino Nose, NP Authorized by: Valentino Nose, NP   Consent:    Consent obtained:  Verbal   Consent given by:  Patient   Risks, benefits, and alternatives were discussed: yes     Risks discussed:  Pain, infection, poor cosmetic result, vascular damage, poor wound healing and nerve damage   Alternatives discussed:  Observation and delayed treatment Universal protocol:    Procedure explained and questions answered to patient or proxy's satisfaction: yes     Patient identity confirmed:  Verbally with patient Anesthesia:    Anesthesia method:  None Laceration details:    Location:  Finger   Finger location:  L index finger   Wound length (cm): oval shaped, avulsed laceration difficult to measure. Exploration:    Hemostasis achieved with:  Direct pressure   Imaging outcome: foreign body not noted     Wound exploration: wound explored through full range of motion     Contaminated: no   Treatment:    Area cleansed  with:  Chlorhexidine   Amount of cleaning:  Standard Skin repair:    Repair method:  Tissue adhesive Approximation:    Approximation:   Loose Repair type:    Repair type:  Simple Post-procedure details:    Dressing:  Splint for protection   Procedure completion:  Tolerated well, no immediate complications  (including critical care time)  Medications Ordered in UC Medications - No data to display  Initial Impression / Assessment and Plan / UC Course  I have reviewed the triage vital signs and the nursing notes.  Pertinent labs & imaging results that were available during my care of the patient were reviewed by me and considered in my medical decision making (see chart for details).   Patient is well-appearing, normotensive, afebrile, not tachycardic, not tachypneic, oxygenating well on room air.    1. Laceration of left index finger without foreign body without damage to nail, initial encounter Wound was cleaned and laceration repair as above Patient tolerated well Finger splint given for protection to prevent reopening of wound Wound care discussed Return precautions discussed as well  The patient was given the opportunity to ask questions.  All questions answered to their satisfaction.  The patient is in agreement to this plan.    Final Clinical Impressions(s) / UC Diagnoses   Final diagnoses:  Laceration of left index finger without foreign body without damage to nail, initial encounter     Discharge Instructions      We cleaned the laceration on your finger and closed the wound with skin glue today.  Please leave the skin glue in place until it falls off.  Do not pick at it.  Wear the finger splint to help protect the cut and provide protection for the skin glue.  It is okay to get the skin wet, just do not soak it.  Seek care if you develop signs or symptoms of infection.     ED Prescriptions   None    PDMP not reviewed this encounter.   Valentino Nose, NP 11/26/22 [phone]

## 2022-11-26 NOTE — Discharge Instructions (Addendum)
We cleaned the laceration on your finger and closed the wound with skin glue today.  Please leave the skin glue in place until it falls off.  Do not pick at it.  Wear the finger splint to help protect the cut and provide protection for the skin glue.  It is okay to get the skin wet, just do not soak it.  Seek care if you develop signs or symptoms of infection.

## 2022-11-26 NOTE — ED Triage Notes (Signed)
Pt states laceration to left 2nd digit states she cut it with a knife last night when she was cleaning chicken. Laceration noted to top of finger, bleeding controlled.

## 2023-03-31 ENCOUNTER — Telehealth: Payer: Self-pay | Admitting: *Deleted

## 2023-03-31 NOTE — Telephone Encounter (Signed)
Spoke with the patient regarding the referral to GYN oncology. Patient scheduled as new patient with Dr Alvester Morin 12/16 at 11:15 am.  Patient given an arrival time of 10:45 am.  Explained to the patient the the doctor will perform a pelvic exam at this visit. Patient given the policy that only one visitor allowed and that visitor must be over 16 yrs are allowed in the Cancer Center. Patient given the address/phone number for the clinic and that the center offers free valet service. Patient aware that masks are option.

## 2023-04-18 ENCOUNTER — Encounter: Payer: Self-pay | Admitting: Psychiatry

## 2023-04-21 ENCOUNTER — Inpatient Hospital Stay: Payer: Medicaid Other | Attending: Psychiatry

## 2023-04-21 ENCOUNTER — Encounter: Payer: Self-pay | Admitting: Psychiatry

## 2023-04-21 ENCOUNTER — Inpatient Hospital Stay (HOSPITAL_BASED_OUTPATIENT_CLINIC_OR_DEPARTMENT_OTHER): Payer: Medicaid Other | Admitting: Gynecologic Oncology

## 2023-04-21 ENCOUNTER — Encounter: Payer: Self-pay | Admitting: Gynecologic Oncology

## 2023-04-21 ENCOUNTER — Inpatient Hospital Stay (HOSPITAL_BASED_OUTPATIENT_CLINIC_OR_DEPARTMENT_OTHER): Payer: Medicaid Other | Admitting: Psychiatry

## 2023-04-21 VITALS — BP 125/84 | HR 66 | Temp 99.0°F | Resp 19 | Ht 63.0 in | Wt 198.0 lb

## 2023-04-21 DIAGNOSIS — D069 Carcinoma in situ of cervix, unspecified: Secondary | ICD-10-CM | POA: Diagnosis present

## 2023-04-21 DIAGNOSIS — Z114 Encounter for screening for human immunodeficiency virus [HIV]: Secondary | ICD-10-CM | POA: Insufficient documentation

## 2023-04-21 DIAGNOSIS — Z793 Long term (current) use of hormonal contraceptives: Secondary | ICD-10-CM | POA: Diagnosis not present

## 2023-04-21 DIAGNOSIS — F32A Depression, unspecified: Secondary | ICD-10-CM | POA: Diagnosis not present

## 2023-04-21 DIAGNOSIS — F419 Anxiety disorder, unspecified: Secondary | ICD-10-CM | POA: Insufficient documentation

## 2023-04-21 DIAGNOSIS — Z79899 Other long term (current) drug therapy: Secondary | ICD-10-CM | POA: Insufficient documentation

## 2023-04-21 LAB — HIV ANTIBODY (ROUTINE TESTING W REFLEX): HIV Screen 4th Generation wRfx: NONREACTIVE

## 2023-04-21 MED ORDER — IBUPROFEN 800 MG PO TABS: 800.0000 mg | ORAL_TABLET | Freq: Three times a day (TID) | ORAL | 0 refills | Status: DC | PRN

## 2023-04-21 MED ORDER — TRAMADOL HCL 50 MG PO TABS: 50.0000 mg | ORAL_TABLET | Freq: Four times a day (QID) | ORAL | 0 refills | Status: DC | PRN

## 2023-04-21 MED ORDER — SENNOSIDES-DOCUSATE SODIUM 8.6-50 MG PO TABS: 2.0000 | ORAL_TABLET | Freq: Every day | ORAL | 0 refills | Status: DC

## 2023-04-21 NOTE — Progress Notes (Signed)
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  Reviewed the nature of cervical dysplasia.  Given high-grade dysplasia, recommend excisional procedure for treatment and rule out microscopic invasive disease.  Reviewed the pros and cons of LEEP with Top-Hat versus cold knife cone.  Patient does not currently desire future pregnancies but not 100% certain of this.  Currently on OCPs for contraception.  Following discussion, plan to proceed with cold knife conization with ECC.  Also recommended that patient try to obtain her HPV vaccination records to determine if repeat vaccination warranted pending if she had 1 or 2 vaccines.  She can discuss further with her PCP and/or her OB/GYN.  Additionally, recommend updated HIV testing.  Ordered today.  Patient was consented for: Cold knife conization, endocervical curettage on 05/27/2023.  The risks of surgery were discussed in detail and she understands these to including but not limited to bleeding requiring a blood transfusion, infection, injury to adjacent organs (including but not limited to the vagina, bladder, rectum, nearby vessels or nerves), unforseen complication, and possible need for re-exploration.  If the patient experiences any of these events, she understands that her hospitalization or recovery may be prolonged and that she may need to take additional medications for a prolonged period. The patient will receive DVT and antibiotic prophylaxis as indicated. She voiced a clear understanding. She had the opportunity to ask questions and informed consent was obtained today. She wishes to proceed.  She will proceed to the lab today for HIV testing.  She does not require preoperative clearance. Her METs are >4.  All preoperative instructions were reviewed. Postoperative  expectations were also reviewed. Written handouts were provided to the patient.   A copy of this note was sent to the patient's referring provider.  Clide Cliff, MD Gynecologic Oncology   Medical Decision Making I personally spent  TOTAL 48 minutes face-to-face and non-face-to-face in the care of this patient, which includes all pre, intra, and post visit time on the date of service.  ------------  CC: Cervical dysplasia  HISTORY OF PRESENT ILLNESS:  Megan Wiley is a 32 y.o. woman who is seen in consultation at the request of Wiliam Ke, [facility] for evaluation of cervical dysplasia.  Patient presented to OB/GYN on 03/24/2023 for colposcopy.  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  On colposcopy she was noted to have acetowhite changes at 5-6 o'clock on the cervix.  Cervical biopsy and ECC were performed.  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  Today, she denies AUB or post coital bleeding. She was without insurance for a prolonged period of time so she believes her last pap was 11 years ago during a prior pregnancy. Denies abnormal pap prior from what she knows. She believes she may have gotten the HPV vaccine when younger around age 2.  Think she got the first 1-2 shots in the series.  She otherwise reports negative STD testing except for when younger in her teenage years.  Last HIV testing was likely during her prior pregnancy 11 years ago.  Negative per her report.  Currently on OCPs for contraception.    PAST MEDICAL HISTORY: Past Medical History:  Diagnosis Date   Anxiety    Depression     PAST SURGICAL HISTORY: Past Surgical History:  Procedure Laterality Date  CESAREAN SECTION     TONSILLECTOMY      OB/GYN HISTORY: OB History  Gravida Para Term Preterm AB Living  3 2 2  0 1 2  SAB IAB Ectopic Multiple Live Births  1 0 0 0 2    # Outcome Date GA Lbr Len/2nd Weight Sex Type  Anes PTL Lv  3 SAB           2 Term      CS-Unspec   LIV     Birth Comments: System Generated. Please review and update pregnancy details.  1 Term      Vag-Spont   LIV      Age at menarche: 48 Age at menopause: n/a Hx of HRT: OCPS for contraception Hx of STI: chlamydia as teenager Last pap: 01/24/23 HSIL, HPV HR+ History of abnormal pap smears: no  SCREENING STUDIES:  Last mammogram: n/a Last colonoscopy: n/a  MEDICATIONS:  Current Outpatient Medications:    ALPRAZolam (XANAX) 0.5 MG tablet, Take by mouth., Disp: , Rfl:    cetirizine (ZYRTEC ALLERGY) 10 MG tablet, Take 1 tablet (10 mg total) by mouth daily., Disp: 30 tablet, Rfl: 0   citalopram (CELEXA) 40 MG tablet, Take 1 tablet (40 mg total) by mouth daily., Disp: 90 tablet, Rfl: 3   mupirocin ointment (BACTROBAN) 2 %, APPLY TO AFFECTED AREA TWICE A DAY FOR 7 DAYS, Disp: , Rfl:    norgestimate-ethinyl estradiol (SPRINTEC 28) 0.25-35 MG-MCG tablet, Take 1 tablet by mouth daily., Disp: 90 tablet, Rfl: 4   predniSONE (DELTASONE) 10 MG tablet, Take 2 tablets (20 mg total) by mouth daily., Disp: 15 tablet, Rfl: 0  ALLERGIES: No Known Allergies  FAMILY HISTORY: Family History  Problem Relation Age of Onset   Hepatitis C Father    Cirrhosis Father    Diabetes Other    Hypertension Other    Colon cancer Neg Hx    Breast cancer Neg Hx    Ovarian cancer Neg Hx    Endometrial cancer Neg Hx    Pancreatic cancer Neg Hx    Prostate cancer Neg Hx     SOCIAL HISTORY: Social History   Socioeconomic History   Marital status: Single    Spouse name: Not on file   Number of children: Not on file   Years of education: Not on file   Highest education level: Not on file  Occupational History   Not on file  Tobacco Use   Smoking status: Never   Smokeless tobacco: Never  Vaping Use   Vaping status: Never Used  Substance and Sexual Activity   Alcohol use: Yes    Comment: occ   Drug use: No   Sexual activity: Yes    Partners:  Male    Birth control/protection: Pill  Other Topics Concern   Not on file  Social History Narrative   Not on file   Social Drivers of Health   Financial Resource Strain: Not on file  Food Insecurity: No Food Insecurity (04/18/2023)   Hunger Vital Sign    Worried About Running Out of Food in the Last Year: Never true    Ran Out of Food in the Last Year: Never true  Transportation Needs: No Transportation Needs (04/18/2023)   PRAPARE - Administrator, Civil Service (Medical): No    Lack of Transportation (Non-Medical): No  Physical Activity: Not on file  Stress: Not on file  Social Connections: Not on file  Intimate Partner Violence: Not At Risk (  04/18/2023)   Humiliation, Afraid, Rape, and Kick questionnaire    Fear of Current or Ex-Partner: No    Emotionally Abused: No    Physically Abused: No    Sexually Abused: No    REVIEW OF SYSTEMS: New patient intake form was reviewed.  Complete 10-system review is negative except for the following: Diarrhea, anxiety, depression  PHYSICAL EXAM: BP 125/84 (BP Location: Right Arm, Patient Position: Sitting)   Pulse 66   Temp 99 F (37.2 C) (Oral)   Resp 19   Ht 5\' 3"  (1.6 m)   Wt 198 lb (89.8 kg)   SpO2 100%   BMI 35.07 kg/m  Constitutional: No acute distress. Neuro/Psych: Alert, oriented.  Head and Neck: Normocephalic, atraumatic. Neck symmetric without masses. Sclera anicteric.  Respiratory: Normal work of breathing. Clear to auscultation bilaterally. Cardiovascular: Regular rate and rhythm, no murmurs, rubs, or gallops. Abdomen: Normoactive bowel sounds. Soft, non-distended, non-tender to palpation.  Extremities: Grossly normal range of motion. Warm, well perfused. No edema bilaterally. Skin: No rashes or lesions. Lymphatic: No cervical, supraclavicular, or inguinal adenopathy. Genitourinary: External genitalia without lesions. Urethral meatus without lesions or prolapse. On speculum exam, vagina and cervix  without lesions.  Exam chaperoned by [name], NP   COLPOSCOPY PROCEDURE NOTE  Procedure Details: After appropriate verbal informed consent was obtained, a timeout was performed. A sterile speculum was placed in the vagina. Acetic acid was applied to the cervix with the findings as noted below. The speculum was removed from the vagina. The patient tolerated the procedure well.   Adequate Exam: no, transformation zone not visualized  Biopsy Specimen: none  Condition: Stable. Patient tolerated procedure well.  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  Outside laboratory, pathology reports were reviewed, with pertinent results below.    WBC  Date Value Ref Range Status  05/23/2011 11.7 (H) 4.0 - 10.5 K/uL Final   Hemoglobin  Date Value Ref Range Status  05/23/2011 12.3 12.0 - 15.0 g/dL Final   HCT  Date Value Ref Range Status  05/23/2011 36.2 36.0 - 46.0 % Final   Platelets  Date Value Ref Range Status  05/23/2011 207 150 - 400 K/uL Final    Pap (01/24/23): HSIL, HPV HR+  Surgical pathology (03/24/23): Cervix, Biopsy 5 and 6:00 -high-grade squamous intraepithelial lesion (CIN 2-3, moderate to severe squamous dysplasia/carcinoma in situ) Endocervix, curettage -fragments of squamous mucosa with high-grade dysplasia, fragments of benign endocervical mucosa

## 2023-04-21 NOTE — Patient Instructions (Signed)
We will release and/or call with the results of your lab work today.  Preparing for your Surgery  Plan for surgery on May 27, 2023 with Dr. Clide Cliff at [facility]. You will be scheduled for pelvic examination under anesthesia, cold knife conization of the cervix, endocervical curettage (sampling from the inner cervix).   Pre-operative Testing -You will receive a phone call from presurgical testing at [facility] to discuss surgery instructions and arrange for lab work if needed.  -Bring your insurance card, copy of an advanced directive if applicable, medication list.  -You should not be taking blood thinners or aspirin at least ten days prior to surgery unless instructed by your surgeon.  -Do not take supplements such as fish oil (omega 3), red yeast rice, turmeric before your surgery. You want to avoid medications with aspirin in them including headache powders such as BC or Goody's), Excedrin migraine.  Day Before Surgery at Home -You will be advised you can have clear liquids up until 3 hours before your surgery.    Your role in recovery Your role is to become active as soon as directed by your doctor, while still giving yourself time to heal.  Rest when you feel tired. You will be asked to do the following in order to speed your recovery:  - Cough and breathe deeply. This helps to clear and expand your lungs and can prevent pneumonia after surgery.  - STAY ACTIVE WHEN YOU GET HOME. Do mild physical activity. Walking or moving your legs help your circulation and body functions return to normal. Do not try to get up or walk alone the first time after surgery.   -If you develop swelling on one leg or the other, pain in the back of your leg, redness/warmth in one of your legs, please call the office or go to the Emergency Room to have a doppler to rule out a blood clot. For shortness of breath, chest pain-seek care in the Emergency Room as  soon as possible. - Actively manage your pain. Managing your pain lets you move in comfort. We will ask you to rate your pain on a scale of zero to 10. It is your responsibility to tell your doctor or nurse where and how much you hurt so your pain can be treated.  Special Considerations -Your final pathology results from surgery should be available around one week after surgery and the results will be relayed to you when available.  -FMLA forms can be faxed to [phone] and please allow 5-7 business days for completion.  Pain Management After Surgery -You will be prescribed your pain medication and bowel regimen medications before surgery so that you can have these available when you are discharged from the hospital. The pain medication is for use ONLY AFTER surgery and a new prescription will not be given.   -Make sure that you have Tylenol and Ibuprofen at home IF YOU ARE ABLE TO TAKE THESE MEDICATIONS to use on a regular basis after surgery for pain control. We recommend alternating the medications every hour to six hours since they work differently and are processed in the body differently for pain relief.  -Review the attached handout on narcotic use and their risks and side effects.   Bowel Regimen -You will be prescribed Sennakot-S to take nightly to prevent constipation especially if you are taking the narcotic pain medication intermittently.  It is important to prevent constipation and drink adequate amounts of liquids. You  can stop taking this medication when you are not taking pain medication and you are back on your normal bowel routine.  Risks of Surgery Risks of surgery are low but include bleeding, infection, damage to surrounding structures, re-operation, blood clots, and very rarely death.  AFTER SURGERY INSTRUCTIONS  Return to work:  1 week if applicable  Activity: 1. Be up and out of the bed during the day.  Take a nap if needed.  You may walk up steps but be careful  and use the hand rail.  Stair climbing will tire you more than you think, you may need to stop part way and rest.   2. No lifting or straining for 1 week over 10 pounds. No pushing, pulling, straining for 1 week.  3. No driving for minimum 24 hours after surgery.  Do not drive if you are taking narcotic pain medicine and make sure that your reaction time has returned.   4. You can shower as soon as the next day after surgery. Shower daily. No tub baths or submerging your body in water until cleared by your surgeon at least 4 weeks. If you have the soap that was given to you by pre-surgical testing that was used before surgery, you do not need to use it afterwards because this can irritate your incisions.   5. No sexual activity and nothing in the vagina for 4-6 weeks.  6. You may experience vaginal spotting and discharge after surgery.  The spotting is normal but if you experience heavy bleeding, call our office.  7. Take Tylenol or ibuprofen first for pain if you are able to take these medications and only use narcotic pain medication for severe pain not relieved by the Tylenol or Ibuprofen.  Monitor your Tylenol intake to a max of 4,000 mg in a 24 hour period. You can alternate these medications after surgery.  Diet: 1. Low sodium Heart Healthy Diet is recommended but you are cleared to resume your normal (before surgery) diet after your procedure.  2. It is safe to use a laxative, such as Miralax or Colace, if you have difficulty moving your bowels. You have been prescribed Sennakot at bedtime every evening to keep bowel movements regular and to prevent constipation.    Wound Care: 1. Keep clean and dry.  Shower daily.  Reasons to call the Doctor: Fever - Oral temperature greater than 100.4 degrees Fahrenheit Foul-smelling vaginal discharge Difficulty urinating Nausea and vomiting Increased pain at the site of the incision that is unrelieved with pain medicine. Difficulty breathing  with or without chest pain New calf pain especially if only on one side Sudden, continuing increased vaginal bleeding with or without clots.   Contacts: For questions or concerns you should contact:  Dr. Clide Cliff at [phone]  [name], NP at [phone]  After Hours: call [phone] and have the GYN Oncologist paged/contacted (after 5 pm or on the weekends).  Messages sent via mychart are for non-urgent matters and are not responded to after hours so for urgent needs, please call the after hours number.

## 2023-04-21 NOTE — Progress Notes (Signed)
Patient here for a pre-operative appointment prior to her scheduled surgery on 04/26/2023. She is scheduled for a pelvic examination under anesthesia, cold knife conization of the cervix, endocervical curettage. The surgery was discussed in detail.  See after visit summary for additional details.    Discussed post-op pain management in detail including the aspects of the enhanced recovery pathway.  Advised her that a new prescription would be sent in for Tramadol and it is only to be used for after her upcoming surgery.  We discussed the use of tylenol post-op and to monitor for a maximum of 4,000 mg in a 24 hour period.  Also prescribed sennakot to be used after surgery and to hold if having loose stools.  Discussed bowel regimen in detail.     Discussed the use of SCDs and measures to take at home to prevent DVT including frequent mobility.  Reportable signs and symptoms of DVT discussed. Post-operative instructions discussed and expectations for after surgery. Incisional care discussed as well including reportable signs and symptoms including erythema, drainage, wound separation.     30 minutes spent with the patient.  Verbalizing understanding of material discussed. No needs or concerns voiced at the end of the visit.   Advised patient to call for any needs.  Advised that her post-operative medications had been prescribed and could be picked up at any time.    This appointment is included in the global surgical bundle as pre-operative teaching and has no charge.

## 2023-04-22 ENCOUNTER — Telehealth: Payer: Self-pay

## 2023-04-22 NOTE — Telephone Encounter (Signed)
-----   Message from [place], [place] sent at 04/21/2023  6:45 PM EST ----- Please notify pt of negative HIV test result.

## 2023-04-22 NOTE — Telephone Encounter (Signed)
Pt is aware of recent negative lab results

## 2023-04-23 ENCOUNTER — Encounter: Payer: Self-pay | Admitting: Women's Health

## 2023-04-23 ENCOUNTER — Other Ambulatory Visit: Payer: Self-pay | Admitting: Gynecologic Oncology

## 2023-04-23 DIAGNOSIS — D069 Carcinoma in situ of cervix, unspecified: Secondary | ICD-10-CM

## 2023-05-20 ENCOUNTER — Other Ambulatory Visit: Payer: Self-pay | Admitting: Gynecologic Oncology

## 2023-05-20 DIAGNOSIS — D069 Carcinoma in situ of cervix, unspecified: Secondary | ICD-10-CM

## 2023-05-21 ENCOUNTER — Encounter (HOSPITAL_COMMUNITY): Payer: Self-pay

## 2023-05-21 ENCOUNTER — Other Ambulatory Visit: Payer: Self-pay

## 2023-05-21 ENCOUNTER — Encounter (HOSPITAL_COMMUNITY)
Admission: RE | Admit: 2023-05-21 | Discharge: 2023-05-21 | Disposition: A | Discharge status: Home / Self Care | Payer: Medicaid Other | Source: Ambulatory Visit | Attending: Psychiatry | Admitting: Psychiatry

## 2023-05-21 VITALS — BP 111/75 | HR 80 | Temp 98.4°F | Resp 16 | Ht 63.0 in | Wt 201.6 lb

## 2023-05-21 DIAGNOSIS — Z01818 Encounter for other preprocedural examination: Secondary | ICD-10-CM | POA: Insufficient documentation

## 2023-05-21 LAB — CBC
HCT: 38.3 % (ref 36.0–46.0)
Hemoglobin: 12.5 g/dL (ref 12.0–15.0)
MCH: 32.6 pg (ref 26.0–34.0)
MCHC: 32.6 g/dL (ref 30.0–36.0)
MCV: 99.7 fL (ref 80.0–100.0)
Platelets: 297 10*3/uL (ref 150–400)
RBC: 3.84 MIL/uL — ABNORMAL LOW (ref 3.87–5.11)
RDW: 13 % (ref 11.5–15.5)
WBC: 6.2 10*3/uL (ref 4.0–10.5)
nRBC: 0 % (ref 0.0–0.2)

## 2023-05-21 NOTE — Progress Notes (Signed)
 COVID Vaccine Completed: no  Date of COVID positive in last 90 days: yes right before christmas, home test  PCP - [facility]  Cardiologist - n/a  Chest x-ray - n/a EKG - n/a Stress Test - n/a ECHO - n/a Cardiac Cath - n/a Pacemaker/ICD device last checked: n/a Spinal Cord Stimulator: n/a  Bowel Prep - no  Sleep Study - n/a CPAP -   Fasting Blood Sugar - n/a Checks Blood Sugar _____ times a day  Last dose of GLP1 agonist-  N/A GLP1 instructions:  Hold 7 days before surgery    Last dose of SGLT-2 inhibitors-  N/A SGLT-2 instructions:  Hold 3 days before surgery    Blood Thinner Instructions:  n/a Aspirin Instructions: Last Dose:  Activity level: Can go up a flight of stairs and perform activities of daily living without stopping and without symptoms of chest pain or shortness of breath.   Anesthesia review:   Patient denies shortness of breath, fever, cough and chest pain at PAT appointment  Patient verbalized understanding of instructions that were given to them at the PAT appointment. Patient was also instructed that they will need to review over the PAT instructions again at home before surgery.

## 2023-05-21 NOTE — Patient Instructions (Addendum)
 SURGICAL WAITING ROOM VISITATION  Patients having surgery or a procedure may have no more than 2 support people in the waiting area - these visitors may rotate.    Children under the age of 52 must have an adult with them who is not the patient.  Due to an increase in RSV and influenza rates and associated hospitalizations, children ages 75 and under may not visit patients in [facility] hospitals.  Visitors with respiratory illnesses are discouraged from visiting and should remain at home.  If the patient needs to stay at the hospital during part of their recovery, the visitor guidelines for inpatient rooms apply. Pre-op nurse will coordinate an appropriate time for 1 support person to accompany patient in pre-op.  This support person may not rotate.    Please refer to the [facility] website for the visitor guidelines for Inpatients (after your surgery is over and you are in a regular room).    Your procedure is scheduled on: 05/27/23   Report to [facility] Main Entrance    Report to admitting at 8:00 AM   Call this number if you have problems the morning of surgery [phone]   Do not eat food :After Midnight.   After Midnight you may have the following liquids until 7:15 AM DAY OF SURGERY  Water  Non-Citrus Juices (without pulp, NO RED-Apple, White grape, White cranberry) Black Coffee (NO MILK/CREAM OR CREAMERS, sugar ok)  Clear Tea (NO MILK/CREAM OR CREAMERS, sugar ok) regular and decaf                             Plain Jell-O (NO RED)                                           Fruit ices (not with fruit pulp, NO RED)                                     Popsicles (NO RED)                                                               Sports drinks like Gatorade (NO RED)                     If you have questions, please contact your surgeon's office.   FOLLOW BOWEL PREP AND ANY ADDITIONAL PRE OP INSTRUCTIONS YOU RECEIVED FROM YOUR SURGEON'S OFFICE!!!     Oral  Hygiene is also important to reduce your risk of infection.                                    Remember - BRUSH YOUR TEETH THE MORNING OF SURGERY WITH YOUR REGULAR TOOTHPASTE  DENTURES WILL BE REMOVED PRIOR TO SURGERY PLEASE DO NOT APPLY "Poly grip" OR ADHESIVES!!!   Stop all vitamins and herbal supplements 7 days before surgery.   Take these medicines the morning of surgery with A SIP OF WATER : Xanax   You may not have any metal on your body including hair pins, jewelry, and body piercing             Do not wear lotions, powders, perfumes, or deodorant  Do not wear nail polish including gel and S&S, artificial/acrylic nails, or any other type of covering on natural nails including finger and toenails. If you have artificial nails, gel coating, etc. that needs to be removed by a nail salon please have this removed prior to surgery or surgery may need to be canceled/ delayed if the surgeon/ anesthesia feels like they are unable to be safely monitored.    Do not bring valuables to the hospital. [facility] IS NOT             RESPONSIBLE   FOR VALUABLES.   Contacts, glasses, dentures or bridgework may not be worn into surgery.  DO NOT BRING YOUR HOME MEDICATIONS TO THE HOSPITAL. PHARMACY WILL DISPENSE MEDICATIONS LISTED ON YOUR MEDICATION LIST TO YOU DURING YOUR ADMISSION IN THE HOSPITAL!    Patients discharged on the day of surgery will not be allowed to drive home.  Someone NEEDS to stay with you for the first 24 hours after anesthesia.              Please read over the following fact sheets you were given: IF YOU HAVE QUESTIONS ABOUT YOUR PRE-OP INSTRUCTIONS PLEASE CALL [phone] Party    If you received a COVID test during your pre-op visit  it is requested that you wear a mask when out in public, stay away from anyone that may not be feeling well and notify your surgeon if you develop symptoms. If you test positive for Covid or have been in contact with anyone that has  tested positive in the last 10 days please notify you surgeon.    [facility] - Preparing for Surgery Before surgery, you can play an important role.  Because skin is not sterile, your skin needs to be as free of germs as possible.  You can reduce the number of germs on your skin by washing with CHG (chlorahexidine gluconate) soap before surgery.  CHG is an antiseptic cleaner which kills germs and bonds with the skin to continue killing germs even after washing. Please DO NOT use if you have an allergy to CHG or antibacterial soaps.  If your skin becomes reddened/irritated stop using the CHG and inform your nurse when you arrive at Short Stay. Do not shave (including legs and underarms) for at least 48 hours prior to the first CHG shower.  You may shave your face/neck.  Please follow these instructions carefully:  1.  Shower with CHG Soap the night before surgery and the  morning of surgery.  2.  If you choose to wash your hair, wash your hair first as usual with your normal  shampoo.  3.  After you shampoo, rinse your hair and body thoroughly to remove the shampoo.                             4.  Use CHG as you would any other liquid soap.  You can apply chg directly to the skin and wash.  Gently with a scrungie or clean washcloth.  5.  Apply the CHG Soap to your body ONLY FROM THE NECK DOWN.   Do   not use on face/ open  Wound or open sores. Avoid contact with eyes, ears mouth and   genitals (private parts).                       Wash face,  Genitals (private parts) with your normal soap.             6.  Wash thoroughly, paying special attention to the area where your    surgery  will be performed.  7.  Thoroughly rinse your body with warm water  from the neck down.  8.  DO NOT shower/wash with your normal soap after using and rinsing off the CHG Soap.                9.  Pat yourself dry with a clean towel.            10.  Wear clean pajamas.            11.  Place clean  sheets on your bed the night of your first shower and do not  sleep with pets. Day of Surgery : Do not apply any lotions/deodorants the morning of surgery.  Please wear clean clothes to the hospital/surgery center.  FAILURE TO FOLLOW THESE INSTRUCTIONS MAY RESULT IN THE CANCELLATION OF YOUR SURGERY  PATIENT SIGNATURE_________________________________  NURSE SIGNATURE__________________________________  ________________________________________________________________________

## 2023-05-26 ENCOUNTER — Telehealth: Payer: Self-pay | Admitting: Surgery

## 2023-05-26 NOTE — Telephone Encounter (Signed)
Telephone call to check on pre-operative status.  Patient compliant with pre-operative instructions.  Reinforced nothing to eat after midnight. Clear liquids until 7am. Patient to arrive at 8am. Verified that post-op medications have been sent to patient's preferred pharmacy.  No questions or concerns voiced.  Instructed to call for any needs.

## 2023-05-27 ENCOUNTER — Encounter (HOSPITAL_COMMUNITY): Payer: Self-pay | Admitting: Psychiatry

## 2023-05-27 ENCOUNTER — Other Ambulatory Visit: Payer: Self-pay

## 2023-05-27 ENCOUNTER — Encounter (HOSPITAL_COMMUNITY)
Admission: RE | Disposition: A | Discharge status: Home / Self Care | Payer: Self-pay | Source: Ambulatory Visit | Attending: Psychiatry

## 2023-05-27 ENCOUNTER — Ambulatory Visit (HOSPITAL_COMMUNITY)
Admission: RE | Admit: 2023-05-27 | Discharge: 2023-05-27 | Disposition: A | Discharge status: Home / Self Care | Payer: Medicaid Other | Source: Ambulatory Visit | Attending: Psychiatry | Admitting: Psychiatry

## 2023-05-27 ENCOUNTER — Ambulatory Visit (HOSPITAL_BASED_OUTPATIENT_CLINIC_OR_DEPARTMENT_OTHER): Payer: Medicaid Other | Admitting: Anesthesiology

## 2023-05-27 ENCOUNTER — Ambulatory Visit (HOSPITAL_COMMUNITY): Payer: Medicaid Other | Admitting: Anesthesiology

## 2023-05-27 DIAGNOSIS — D069 Carcinoma in situ of cervix, unspecified: Secondary | ICD-10-CM | POA: Diagnosis present

## 2023-05-27 DIAGNOSIS — D061 Carcinoma in situ of exocervix: Secondary | ICD-10-CM | POA: Diagnosis not present

## 2023-05-27 HISTORY — PX: CERVICAL CONIZATION W/BX: SHX1330

## 2023-05-27 LAB — POCT PREGNANCY, URINE: Preg Test, Ur: NEGATIVE

## 2023-05-27 SURGERY — CONIZATION CERVIX WITH BIOPSY
Anesthesia: General

## 2023-05-27 MED ORDER — IODINE STRONG (LUGOLS) 5 % PO SOLN
ORAL | Status: DC | PRN
Start: 2023-05-27 — End: 2023-05-27
  Administered 2023-05-27: .2 mL

## 2023-05-27 MED ORDER — FENTANYL CITRATE PF 50 MCG/ML IJ SOSY
25.0000 ug | PREFILLED_SYRINGE | INTRAMUSCULAR | Status: DC | PRN
Start: 2023-05-27 — End: 2023-05-27
  Administered 2023-05-27: 50 ug via INTRAVENOUS

## 2023-05-27 MED ORDER — FENTANYL CITRATE PF 50 MCG/ML IJ SOSY
PREFILLED_SYRINGE | INTRAMUSCULAR | Status: AC
  Filled 2023-05-27: qty 2

## 2023-05-27 MED ORDER — SILVER NITRATE-POT NITRATE 75-25 % EX MISC
CUTANEOUS | Status: AC
  Filled 2023-05-27: qty 10

## 2023-05-27 MED ORDER — IODINE STRONG (LUGOLS) 5 % PO SOLN
ORAL | Status: AC
  Filled 2023-05-27: qty 1

## 2023-05-27 MED ORDER — MONSELS FERRIC SUBSULFATE EX SOLN
CUTANEOUS | Status: DC | PRN
Start: 2023-05-27 — End: 2023-05-27
  Administered 2023-05-27: 1 via TOPICAL

## 2023-05-27 MED ORDER — ACETAMINOPHEN 325 MG PO TABS: 325.0000 mg | ORAL_TABLET | ORAL | Status: DC | PRN

## 2023-05-27 MED ORDER — FENTANYL CITRATE (PF) 100 MCG/2ML IJ SOLN
INTRAMUSCULAR | Status: DC | PRN
  Administered 2023-05-27: 50 ug via INTRAVENOUS
  Administered 2023-05-27: 25 ug via INTRAVENOUS
  Administered 2023-05-27: 50 ug via INTRAVENOUS

## 2023-05-27 MED ORDER — STERILE WATER FOR IRRIGATION IR SOLN
Status: DC | PRN
  Administered 2023-05-27: 500 mL

## 2023-05-27 MED ORDER — CHLORHEXIDINE GLUCONATE 0.12 % MT SOLN
15.0000 mL | Freq: Once | OROMUCOSAL | Status: AC
  Administered 2023-05-27: 15 mL via OROMUCOSAL

## 2023-05-27 MED ORDER — MONSELS FERRIC SUBSULFATE EX SOLN
CUTANEOUS | Status: AC
  Filled 2023-05-27: qty 8

## 2023-05-27 MED ORDER — ACETAMINOPHEN 500 MG PO TABS
1000.0000 mg | ORAL_TABLET | ORAL | Status: AC
  Administered 2023-05-27: 1000 mg via ORAL
  Filled 2023-05-27: qty 2

## 2023-05-27 MED ORDER — DROPERIDOL 2.5 MG/ML IJ SOLN: 0.6250 mg | Freq: Once | INTRAMUSCULAR | Status: DC | PRN

## 2023-05-27 MED ORDER — ONDANSETRON HCL 4 MG/2ML IJ SOLN
INTRAMUSCULAR | Status: AC
  Filled 2023-05-27: qty 2

## 2023-05-27 MED ORDER — PROPOFOL 10 MG/ML IV BOLUS
INTRAVENOUS | Status: DC | PRN
  Administered 2023-05-27: 300 mg via INTRAVENOUS

## 2023-05-27 MED ORDER — FENTANYL CITRATE (PF) 100 MCG/2ML IJ SOLN
INTRAMUSCULAR | Status: AC
  Filled 2023-05-27: qty 2

## 2023-05-27 MED ORDER — DEXAMETHASONE SODIUM PHOSPHATE 10 MG/ML IJ SOLN
INTRAMUSCULAR | Status: DC | PRN
  Administered 2023-05-27: 5 mg via INTRAVENOUS

## 2023-05-27 MED ORDER — LACTATED RINGERS IV SOLN: INTRAVENOUS | Status: DC

## 2023-05-27 MED ORDER — ACETAMINOPHEN 160 MG/5ML PO SOLN: 325.0000 mg | ORAL | Status: DC | PRN

## 2023-05-27 MED ORDER — ONDANSETRON HCL 4 MG/2ML IJ SOLN
INTRAMUSCULAR | Status: DC | PRN
  Administered 2023-05-27: 4 mg via INTRAVENOUS

## 2023-05-27 MED ORDER — LIDOCAINE HCL (PF) 1 % IJ SOLN
INTRAMUSCULAR | Status: DC | PRN
Start: 2023-05-27 — End: 2023-05-27
  Administered 2023-05-27: 5 mL

## 2023-05-27 MED ORDER — OXYCODONE HCL 5 MG PO TABS
5.0000 mg | ORAL_TABLET | Freq: Once | ORAL | Status: AC | PRN
  Administered 2023-05-27: 5 mg via ORAL

## 2023-05-27 MED ORDER — ORAL CARE MOUTH RINSE: 15.0000 mL | Freq: Once | OROMUCOSAL | Status: AC

## 2023-05-27 MED ORDER — GLYCOPYRROLATE 0.2 MG/ML IJ SOLN
INTRAMUSCULAR | Status: DC | PRN
  Administered 2023-05-27: .2 mg via INTRAVENOUS

## 2023-05-27 MED ORDER — SCOPOLAMINE 1 MG/3DAYS TD PT72
1.0000 | MEDICATED_PATCH | TRANSDERMAL | Status: DC
Start: 2023-05-27 — End: 2023-05-27
  Administered 2023-05-27: 1.5 mg via TRANSDERMAL
  Filled 2023-05-27: qty 1

## 2023-05-27 MED ORDER — MIDAZOLAM HCL 2 MG/2ML IJ SOLN
INTRAMUSCULAR | Status: AC
Start: 2023-05-27 — End: ?
  Filled 2023-05-27: qty 2

## 2023-05-27 MED ORDER — ACETIC ACID 5 % SOLN
Status: DC | PRN
Start: 2023-05-27 — End: 2023-05-27
  Administered 2023-05-27: 1 via TOPICAL

## 2023-05-27 MED ORDER — PHENYLEPHRINE HCL-NACL 20-0.9 MG/250ML-% IV SOLN
INTRAVENOUS | Status: AC
  Filled 2023-05-27: qty 250

## 2023-05-27 MED ORDER — DEXAMETHASONE SODIUM PHOSPHATE 10 MG/ML IJ SOLN
INTRAMUSCULAR | Status: AC
  Filled 2023-05-27: qty 1

## 2023-05-27 MED ORDER — LACTATED RINGERS IV SOLN: INTRAVENOUS | Status: DC | PRN

## 2023-05-27 MED ORDER — ACETIC ACID 5 % SOLN
Status: AC
  Filled 2023-05-27: qty 50

## 2023-05-27 MED ORDER — PROPOFOL 10 MG/ML IV BOLUS
INTRAVENOUS | Status: AC
  Filled 2023-05-27: qty 20

## 2023-05-27 MED ORDER — MIDAZOLAM HCL 5 MG/5ML IJ SOLN
INTRAMUSCULAR | Status: DC | PRN
  Administered 2023-05-27: 2 mg via INTRAVENOUS

## 2023-05-27 MED ORDER — OXYCODONE HCL 5 MG/5ML PO SOLN: 5.0000 mg | Freq: Once | ORAL | Status: AC | PRN

## 2023-05-27 MED ORDER — DEXAMETHASONE SODIUM PHOSPHATE 4 MG/ML IJ SOLN: 4.0000 mg | INTRAMUSCULAR | Status: DC

## 2023-05-27 MED ORDER — GABAPENTIN 300 MG PO CAPS
300.0000 mg | ORAL_CAPSULE | ORAL | Status: AC
  Administered 2023-05-27: 300 mg via ORAL
  Filled 2023-05-27: qty 1

## 2023-05-27 MED ORDER — ACETAMINOPHEN 10 MG/ML IV SOLN: 1000.0000 mg | Freq: Once | INTRAVENOUS | Status: DC | PRN

## 2023-05-27 MED ORDER — OXYCODONE HCL 5 MG PO TABS
ORAL_TABLET | ORAL | Status: AC
  Filled 2023-05-27: qty 1

## 2023-05-27 MED ORDER — LIDOCAINE HCL (PF) 1 % IJ SOLN
INTRAMUSCULAR | Status: AC
  Filled 2023-05-27: qty 30

## 2023-05-27 SURGICAL SUPPLY — 26 items
BAG COUNTER SPONGE SURGICOUNT (BAG) ×1
BLADE SURG SZ11 CARB STEEL (BLADE) ×1
CATH ROBINSON RED A/P 16FR (CATHETERS) ×1
CNTNR URN SCR LID CUP LEK RST (MISCELLANEOUS) ×1
DEVICE MYOSURE LITE (MISCELLANEOUS)
DEVICE MYOSURE REACH (MISCELLANEOUS)
DILATOR CANAL MILEX (MISCELLANEOUS)
DRSG TEGADERM 2-3/8X2-3/4 SM (GAUZE/BANDAGES/DRESSINGS) ×1
GAUZE 4X4 16PLY ~~LOC~~+RFID DBL (SPONGE) ×1
GLOVE BIO SURGEON STRL SZ 6 (GLOVE) ×1
GLOVE BIO SURGEON STRL SZ 6.5 (GLOVE)
GOWN STRL REUS W/ TWL LRG LVL3 (GOWN DISPOSABLE) ×1
HEMOSTAT SURGICEL 4X8 (HEMOSTASIS) ×1
IV NS IRRIG 3000ML ARTHROMATIC (IV SOLUTION)
KIT PROCEDURE FLUENT (KITS)
KIT TURNOVER KIT A (KITS)
LOOP CUTTING BIPOLAR 21FR (ELECTRODE)
MYOSURE XL FIBROID (MISCELLANEOUS) IMPLANT
PACK LITHOTOMY IV (CUSTOM PROCEDURE TRAY) ×1
PACK VAGINAL WOMENS (CUSTOM PROCEDURE TRAY) ×1
PAD OB MATERNITY 11 LF (PERSONAL CARE ITEMS)
SEAL ROD LENS SCOPE MYOSURE (ABLATOR)
SUT VIC AB 2-0 CT1 TAPERPNT 27 (SUTURE) ×2
TOWEL OR 17X26 10 PK STRL BLUE (TOWEL DISPOSABLE) ×1
WATER STERILE IRR 500ML POUR (IV SOLUTION)
YANKAUER SUCT BULB TIP NO VENT (SUCTIONS) ×1

## 2023-05-27 NOTE — Op Note (Signed)
GYNECOLOGIC ONCOLOGY OPERATIVE NOTE  Date of Service: 05/27/2023  Preoperative Diagnosis: CIN3  Postoperative Diagnosis: Same  Procedures: Cold knife conization and endocervical curettage  Surgeon: Clide Cliff, MD  Assistants: None  Anesthesia: Choice  Estimated Blood Loss: 50 mL    Fluids: 700 ml, crystalloid  Urine Output: 50 ml, clear yellow  Findings: On bimanual exam, normal cervix and uterus. On speculum exam, grossly normal appearing cervix. With acetic acid, minimal acetowhite changes within the cervical os. With lugol's solution, decreased uptake at external cervical os. Hemostasis at conclusion of procedure.  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  Prior to the procedure, all risks, benefits, and alternatives were discussed and informed surgical consent was signed.  Procedure: The patient was taken to the operating room where she was prepped and draped in the normal sterile fashion in the dorsal lithotomy position.  Anesthesia was obtained without difficulty.  A speculum was placed in the vagina. A cervical block was performed with injecting 5cc of 1% lidocaine each at 3 and 9 o'clock. Two sutures of 2-0 Vicryl were used to ligate the cervical branches of the cervical artery at the 3 and 9 o'clock positions in a figure-of-eight suture and then tagged.   Acetic acid followed by Lugol solution was applied to the cervix to visualize the transformation zone. Using an 11-blade scalpel, cold-knife cone specimen was then obtained in standard fashion.  A stitch was placed at the 12 o'clock position. Next,  endocervical curetting was performed of the canal. This was handed off also for pathologic review.  The base of the cervical cone specimen was then cauterized using Bovie cautery. Monsel's were then applied. The cone site was noted to be hemostatic. Surgicell was placed in the cone bed and the lateral sutures were gently tied over the cervix to secure this pack at the cervical bed.    All the instruments were removed from the patient's vagina. Sponge, lap and needle counts were correct x2.   No antibiotics were indicated.  The patient tolerated the procedure well and was transferred to the recovery room in stable condition.   Clide Cliff, MD Gynecologic Oncology

## 2023-05-27 NOTE — Anesthesia Preprocedure Evaluation (Addendum)
Anesthesia Evaluation  Patient identified by MRN, date of birth, ID band Patient awake    Reviewed: Allergy & Precautions, NPO status , Patient's Chart, lab work & pertinent test results  Airway Mallampati: I  TM Distance: >3 FB Neck ROM: Full    Dental  (+) Teeth Intact, Dental Advisory Given   Pulmonary neg pulmonary ROS   breath sounds clear to auscultation       Cardiovascular negative cardio ROS  Rhythm:Regular Rate:Normal     Neuro/Psych  PSYCHIATRIC DISORDERS Anxiety Depression    negative neurological ROS     GI/Hepatic negative GI ROS, Neg liver ROS,,,  Endo/Other  negative endocrine ROS    Renal/GU negative Renal ROS     Musculoskeletal negative musculoskeletal ROS (+)    Abdominal   Peds  Hematology negative hematology ROS (+)   Anesthesia Other Findings   Reproductive/Obstetrics                             Anesthesia Physical Anesthesia Plan  ASA: 2  Anesthesia Plan: General   Post-op Pain Management: Tylenol PO (pre-op)* and Toradol IV (intra-op)*   Induction: Intravenous  PONV Risk Score and Plan: 4 or greater and Ondansetron, Dexamethasone, Midazolam and Scopolamine patch - Pre-op  Airway Management Planned: LMA  Additional Equipment: None  Intra-op Plan:   Post-operative Plan: Extubation in OR  Informed Consent: I have reviewed the patients History and Physical, chart, labs and discussed the procedure including the risks, benefits and alternatives for the proposed anesthesia with the patient or authorized representative who has indicated his/her understanding and acceptance.     Dental advisory given  Plan Discussed with: CRNA  Anesthesia Plan Comments:        Anesthesia Quick Evaluation

## 2023-05-27 NOTE — Transfer of Care (Signed)
Immediate Anesthesia Transfer of Care Note  Patient: Megan Wiley  Procedure(s) Performed: CONIZATION CERVIX WITH BIOPSY, ENDOCERVICAL CURETTAGE  Patient Location: PACU  Anesthesia Type:General  Level of Consciousness: awake and alert   Airway & Oxygen Therapy: Patient Spontanous Breathing and Patient connected to face mask oxygen  Post-op Assessment: Report given to RN and Post -op Vital signs reviewed and stable  Post vital signs: Reviewed and stable  Last Vitals:  Vitals Value Taken Time  BP 105/69 05/27/23 1145  Temp    Pulse 59 05/27/23 1145  Resp 9 05/27/23 1145  SpO2 100 % 05/27/23 1145  Vitals shown include unfiled device data.  Last Pain:  Vitals:   05/27/23 0915  TempSrc: Oral  PainSc:          Complications: No notable events documented.

## 2023-05-27 NOTE — Anesthesia Postprocedure Evaluation (Signed)
Anesthesia Post Note  Patient: Megan Wiley  Procedure(s) Performed: CONIZATION CERVIX WITH BIOPSY, ENDOCERVICAL CURETTAGE     Patient location during evaluation: PACU Anesthesia Type: General Level of consciousness: awake and alert Pain management: pain level controlled Vital Signs Assessment: post-procedure vital signs reviewed and stable Respiratory status: spontaneous breathing, nonlabored ventilation, respiratory function stable and patient connected to nasal cannula oxygen Cardiovascular status: blood pressure returned to baseline and stable Postop Assessment: no apparent nausea or vomiting Anesthetic complications: no  No notable events documented.  Last Vitals:  Vitals:   05/27/23 1245 05/27/23 1304  BP: 134/69 (!) 125/94  Pulse: (!) 59 60  Resp: 17 20  Temp:  37 C  SpO2: 98% 100%    Last Pain:  Vitals:   05/27/23 1304  TempSrc: Oral  PainSc: 0-No pain                 Shelton Silvas

## 2023-05-27 NOTE — H&P (Signed)
Brief Pre-operative History & Physical  Patient name: Megan Wiley CSN: [id number] MRN: [id number] Admit Date: 05/27/2023 Date of Surgery: 05/27/2023 Performing Service: Gynecology   Code Status: Full Code    Assessment & Plan    Megan Wiley is a 33 y.o. female with Cervical Intraepithelial neoplasia three, who presents for: Procedure(s) (LRB): CONIZATION CERVIX WITH BIOPSY, ENDOCERVICAL CURETTAGE (N/A).   Consent obtained in office is accurate. Risks, benefits, and alternatives to surgery were reviewed, and all questions were answered.  Proceed to the OR as planned.     History of Present Illness:  Megan Wiley is a 33 y.o. female with Cervical Intraepithelial neoplasia three. She was recently seen in clinic, where a detailed HPI can be found. She was noted to benefit from: Procedure(s) (LRB): CONIZATION CERVIX WITH BIOPSY, ENDOCERVICAL CURETTAGE (N/A).   Medical History Past Medical History:  Diagnosis Date   Anxiety    Depression    Surgical History Past Surgical History:  Procedure Laterality Date   CESAREAN SECTION     TONSILLECTOMY     Allergies Patient has no known allergies.  Medications   Current Facility-Administered Medications  Medication Dose Route Frequency Provider Last Rate Last Admin   dexamethasone (DECADRON) injection 4 mg  4 mg Intravenous On Call to OR Cross, Laurene Footman, NP       lactated ringers infusion   Intravenous Continuous Val Eagle, MD       scopolamine (TRANSDERM-SCOP) 1 MG/3DAYS 1.5 mg  1 patch Transdermal On Call to OR [name] D, NP   1.5 mg at 05/27/23 0843    Vital Signs Ht 5\' 3"  (1.6 m)   Wt 201 lb 9.6 oz (91.4 kg)   LMP 05/07/2023 (Approximate)   BMI 35.71 kg/m  Facility age limit for growth %iles is 20 years. Facility age limit for growth %iles is 20 years..   Physical Exam General: Well developed, appears stated age, in no acute distress  Mental status: Alert and oriented x3 Cardiovascular:  Normal Pulmonary: Symmetric chest rise, unlabored breathing Relevant System for Surgery: Surgical site examination deferred to the OR   Labs and Studies: Lab Results  Component Value Date   WBC 6.2 05/21/2023   HGB 12.5 05/21/2023   HCT 38.3 05/21/2023   PLT 297 05/21/2023    Lab Results  Component Value Date   INR 0.93 05/23/2011   APTT 26 05/23/2011   \

## 2023-05-27 NOTE — Anesthesia Procedure Notes (Signed)
Procedure Name: LMA Insertion Date/Time: 05/27/2023 10:58 AM  Performed by: Deri Fuelling, CRNAPre-anesthesia Checklist: Patient identified, Emergency Drugs available, Suction available and Patient being monitored Patient Re-evaluated:Patient Re-evaluated prior to induction Oxygen Delivery Method: Circle system utilized Preoxygenation: Pre-oxygenation with 100% oxygen Induction Type: IV induction Ventilation: Mask ventilation without difficulty LMA: LMA with gastric port inserted LMA Size: 4.0 Tube type: Oral Number of attempts: 1 Airway Equipment and Method: Oral airway Placement Confirmation: ETT inserted through vocal cords under direct vision, positive ETCO2 and breath sounds checked- equal and bilateral Tube secured with: Tape Dental Injury: Teeth and Oropharynx as per pre-operative assessment

## 2023-05-27 NOTE — Discharge Instructions (Addendum)
AFTER SURGERY INSTRUCTIONS   Return to work:  1 week if applicable   Activity: 1. Be up and out of the bed during the day.  Take a nap if needed.  You may walk up steps but be careful and use the hand rail.  Stair climbing will tire you more than you think, you may need to stop part way and rest.    2. No lifting or straining for 1 week over 10 pounds. No pushing, pulling, straining for 1 week.   3. No driving for minimum 24 hours after surgery.  Do not drive if you are taking narcotic pain medicine and make sure that your reaction time has returned.    4. You can shower as soon as the next day after surgery. Shower daily. No tub baths or submerging your body in water until cleared by your surgeon at least 4 weeks. If you have the soap that was given to you by pre-surgical testing that was used before surgery, you do not need to use it afterwards because this can irritate your incisions.    5. No sexual activity and nothing in the vagina for 4-6 weeks.   6. You may experience vaginal spotting and discharge after surgery.  The spotting is normal but if you experience heavy bleeding, call our office.   7. Take Tylenol or ibuprofen first for pain if you are able to take these medications and only use narcotic pain medication for severe pain not relieved by the Tylenol or Ibuprofen.  Monitor your Tylenol intake to a max of 4,000 mg in a 24 hour period. You can alternate these medications after surgery.   Diet: 1. Low sodium Heart Healthy Diet is recommended but you are cleared to resume your normal (before surgery) diet after your procedure.   2. It is safe to use a laxative, such as Miralax or Colace, if you have difficulty moving your bowels. You have been prescribed Sennakot at bedtime every evening to keep bowel movements regular and to prevent constipation.     Wound Care: 1. Keep clean and dry.  Shower daily. 2. Okay to remove dressing on buttocks in 24 hours   Reasons to call the  Doctor: Fever - Oral temperature greater than 100.4 degrees Fahrenheit Foul-smelling vaginal discharge Difficulty urinating Nausea and vomiting Increased pain at the site of the incision that is unrelieved with pain medicine. Difficulty breathing with or without chest pain New calf pain especially if only on one side Sudden, continuing increased vaginal bleeding with or without clots.   Contacts: For questions or concerns you should contact:   Dr. Clide Cliff at [phone]   [name], NP at [phone]   After Hours: call [phone] and have the GYN Oncologist paged/contacted (after 5 pm or on the weekends).   Messages sent via mychart are for non-urgent matters and are not responded to after hours so for urgent needs, please call the after hours number.

## 2023-05-28 ENCOUNTER — Telehealth: Payer: Self-pay | Admitting: *Deleted

## 2023-05-28 ENCOUNTER — Encounter (HOSPITAL_COMMUNITY): Payer: Self-pay | Admitting: Psychiatry

## 2023-05-28 LAB — SURGICAL PATHOLOGY

## 2023-05-28 NOTE — Telephone Encounter (Signed)
Spoke with Megan Wiley this morning. She states she is eating, drinking and urinating well. She has not had a BM yet but is passing gas. She is taking senokot as prescribed and encouraged her to drink plenty of water. She denies fever or chills. Incisions are dry and intact. She rates her pain 6/10. Her pain is controlled with tramadol and ibuprofen.     Instructed to call office with any fever, chills, purulent drainage, uncontrolled pain or any other questions or concerns. Patient verbalizes understanding.   Pt aware of post op appointments as well as the office number [phone] and after hours number [phone] to call if she has any questions or concerns

## 2023-05-31 ENCOUNTER — Encounter: Payer: Self-pay | Admitting: Psychiatry

## 2023-06-02 ENCOUNTER — Telehealth: Payer: Self-pay | Admitting: *Deleted

## 2023-06-02 NOTE — Telephone Encounter (Signed)
Spoke with Megan Wiley and relayed message from [name], NP that the material that came out of the vagina is normal. There is a gauze like material used in surgery that is placed on the cervix at the end of surgery. This can typically fall out and can appear dark, ect. Like in the picture when it has mixed with blood. Pt verbalized understanding, states she hasn't passed anything else, and states she had also started her period several days ago. Pt thanked the office for calling and reminded her of her follow up appt. With Dr. Alvester Morin on 06/09/23.

## 2023-06-03 ENCOUNTER — Encounter: Payer: Self-pay | Admitting: Psychiatry

## 2023-06-09 ENCOUNTER — Inpatient Hospital Stay: Payer: Medicaid Other | Attending: Psychiatry | Admitting: Psychiatry

## 2023-06-09 VITALS — BP 121/65 | HR 85 | Temp 98.6°F | Resp 16 | Wt 204.2 lb

## 2023-06-09 DIAGNOSIS — Z9889 Other specified postprocedural states: Secondary | ICD-10-CM | POA: Diagnosis not present

## 2023-06-09 DIAGNOSIS — Z7189 Other specified counseling: Secondary | ICD-10-CM

## 2023-06-09 DIAGNOSIS — D069 Carcinoma in situ of cervix, unspecified: Secondary | ICD-10-CM | POA: Diagnosis present

## 2023-06-09 NOTE — Patient Instructions (Signed)
It was a pleasure to see you in clinic today. - Continue with nothing in the vagina for the next 2-4 weeks. - you will need a repeat pap smear in 6months with your ob/gyn  Thank you very much for allowing me to provide care for you today.  I appreciate your confidence in choosing our Gynecologic Oncology team at [facility].  If you have any questions about your visit today please call our office or send [place] a MyChart message and we will get back to you as soon as possible.

## 2023-06-09 NOTE — Progress Notes (Unsigned)
Gynecologic Oncology Return Clinic Visit  Date of Service: 06/09/2023 Referring Provider: Wiliam Ke, [facility]   Assessment & Plan: Megan Wiley is a 33 y.o. woman with CIN3 who is s/p CKC and ECC on 05/27/23.  Postop: - Pt recovering well from surgery and healing appropriately postoperatively - Intraoperative findings and pathology results reviewed. - Ongoing postoperative expectations and precautions reviewed. Continue with no lifting >10lbs through 6 weeks postoperatively - Pt works ***. Okay to return to work at [employer] - Given that uterus is in situ, pt advised that she should continue with pap smear screening per routine until age 29 if she continues with negative/low grade paps.  - Reviewed that after 12 months without menstrual cycles, she should not have any spotting or bleeding.  If this were to occur, she should be evaluated for postmenopausal bleeding.  Cervical dysplasia: CIN3, neg margins  ***VTE Prophylaxis: - Khorana score = ***  RTC ***.  Clide Cliff, MD Gynecologic Oncology   Medical Decision Making I personally spent  TOTAL *** minutes face-to-face and non-face-to-face in the care of this patient, which includes all pre, intra, and post visit time on the date of service. The discussion of *** is beyond the scope of routine postoperative care.   ----------------------- Reason for Visit: Postop***  Treatment History: Oncology History   No history exists.    Interval History: Pt reports that she is recovering well from surgery. Pain is controlled. She is eating and drinking well. She is voiding without issue and having regular bowel movements. Bleeding has slowed down. Has some discharge with odor.  Past Medical/Surgical History: Past Medical History:  Diagnosis Date   Anxiety    Depression     Past Surgical History:  Procedure Laterality Date   CERVICAL CONIZATION W/BX N/A 05/27/2023   Procedure: CONIZATION CERVIX WITH BIOPSY, ENDOCERVICAL  CURETTAGE;  Surgeon: Clide Cliff, MD;  Location: [facility] ORS;  Service: Gynecology;  Laterality: N/A;   CESAREAN SECTION     TONSILLECTOMY      Family History  Problem Relation Age of Onset   Hepatitis C Father    Cirrhosis Father    Diabetes Other    Hypertension Other    Colon cancer Neg Hx    Breast cancer Neg Hx    Ovarian cancer Neg Hx    Endometrial cancer Neg Hx    Pancreatic cancer Neg Hx    Prostate cancer Neg Hx     Social History   Socioeconomic History   Marital status: Single    Spouse name: Not on file   Number of children: Not on file   Years of education: Not on file   Highest education level: Not on file  Occupational History   Not on file  Tobacco Use   Smoking status: Never   Smokeless tobacco: Never  Vaping Use   Vaping status: Never Used  Substance and Sexual Activity   Alcohol use: Yes    Comment: occ   Drug use: Yes    Types: Marijuana    Comment: once every few months   Sexual activity: Yes    Partners: Male    Birth control/protection: Pill  Other Topics Concern   Not on file  Social History Narrative   Not on file   Social Drivers of Health   Financial Resource Strain: Not on file  Food Insecurity: No Food Insecurity (04/18/2023)   Hunger Vital Sign    Worried About Running Out of Food in the Last Year: Never  true    Ran Out of Food in the Last Year: Never true  Transportation Needs: No Transportation Needs (04/18/2023)   PRAPARE - Administrator, Civil Service (Medical): No    Lack of Transportation (Non-Medical): No  Physical Activity: Not on file  Stress: Not on file  Social Connections: Not on file    Current Medications:  Current Outpatient Medications:    acidophilus (RISAQUAD) CAPS capsule, Take 1 capsule by mouth daily., Disp: , Rfl:    ALPRAZolam (XANAX) 0.5 MG tablet, Take 0.5 mg by mouth 2 (two) times daily as needed for anxiety., Disp: , Rfl:    cetirizine (ZYRTEC ALLERGY) 10 MG tablet, Take 1 tablet  (10 mg total) by mouth daily., Disp: 30 tablet, Rfl: 0   norgestimate-ethinyl estradiol (SPRINTEC 28) 0.25-35 MG-MCG tablet, Take 1 tablet by mouth daily., Disp: 90 tablet, Rfl: 4  Review of Symptoms: Complete 10-system review is positive for: Headache, anxiety, vaginal discharge  Physical Exam: BP 121/65 (BP Location: Right Arm, Patient Position: Sitting)   Pulse 85   Temp 98.6 F (37 C) (Oral)   Resp 16   Wt 204 lb 3.2 oz (92.6 kg)   LMP 05/07/2023 (Approximate)   SpO2 97%   BMI 36.17 kg/m  General: Alert, oriented, no acute distress. HEENT: Normocephalic, atraumatic.  Chest: Normal work of breathing.  Abdomen: Soft, nontender.   Extremities: Grossly normal range of motion.  Warm, well perfused.  No edema bilaterally. Skin: Healing small pustule on left buttocks, in area of hair GU: Normal appearing external genitalia without erythema, excoriation, or lesions.  Speculum exam reveals appropriately healing cervical cone bed, normal discharge.  Bimanual exam reveals defect in cervical bed, no TTP. Exam chaperoned by Kimberly [place], CMA   Laboratory & Radiologic Studies: Surgical pathology (05/27/23): A. 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 ENDOCERVIX, CURETTAGE:  Mucohemorrhagic debris with admixed benign endocervical epithelium and  lower uterine segment

## 2023-06-10 ENCOUNTER — Encounter: Payer: Self-pay | Admitting: Psychiatry

## 2023-10-24 ENCOUNTER — Other Ambulatory Visit: Payer: Self-pay

## 2023-10-24 ENCOUNTER — Emergency Department (HOSPITAL_COMMUNITY)
Admission: EM | Admit: 2023-10-24 | Discharge: 2023-10-24 | Disposition: A | Discharge status: Home / Self Care | Attending: Emergency Medicine | Admitting: Emergency Medicine

## 2023-10-24 ENCOUNTER — Encounter (HOSPITAL_COMMUNITY): Payer: Self-pay | Admitting: Emergency Medicine

## 2023-10-24 ENCOUNTER — Emergency Department (HOSPITAL_COMMUNITY)

## 2023-10-24 DIAGNOSIS — R569 Unspecified convulsions: Secondary | ICD-10-CM | POA: Diagnosis present

## 2023-10-24 LAB — COMPREHENSIVE METABOLIC PANEL WITH GFR
ALT: 23 U/L (ref 0–44)
AST: 41 U/L (ref 15–41)
Albumin: 4.2 g/dL (ref 3.5–5.0)
Alkaline Phosphatase: 48 U/L (ref 38–126)
Anion gap: 12 (ref 5–15)
BUN: 7 mg/dL (ref 6–20)
CO2: 22 mmol/L (ref 22–32)
Calcium: 10 mg/dL (ref 8.9–10.3)
Chloride: 103 mmol/L (ref 98–111)
Creatinine, Ser: 0.79 mg/dL (ref 0.44–1.00)
GFR, Estimated: >60 mL/min (ref >60)
Glucose, Bld: 114 mg/dL — ABNORMAL HIGH (ref 70–99)
Potassium: 3.6 mmol/L (ref 3.5–5.1)
Sodium: 137 mmol/L (ref 135–145)
Total Bilirubin: 0.8 mg/dL (ref 0.0–1.2)
Total Protein: 6.9 g/dL (ref 6.5–8.1)

## 2023-10-24 LAB — CBC WITH DIFFERENTIAL/PLATELET
Abs Immature Granulocytes: 0.02 10*3/uL (ref 0.00–0.07)
Basophils Absolute: 0 10*3/uL (ref 0.0–0.1)
Basophils Relative: 0 %
Eosinophils Absolute: 0 10*3/uL (ref 0.0–0.5)
Eosinophils Relative: 0 %
HCT: 41.5 % (ref 36.0–46.0)
Hemoglobin: 13.8 g/dL (ref 12.0–15.0)
Immature Granulocytes: 0 %
Lymphocytes Relative: 14 %
Lymphs Abs: 1.3 10*3/uL (ref 0.7–4.0)
MCH: 33.4 pg (ref 26.0–34.0)
MCHC: 33.3 g/dL (ref 30.0–36.0)
MCV: 100.5 fL — ABNORMAL HIGH (ref 80.0–100.0)
Monocytes Absolute: 0.8 10*3/uL (ref 0.1–1.0)
Monocytes Relative: 8 %
Neutro Abs: 7.3 10*3/uL (ref 1.7–7.7)
Neutrophils Relative %: 78 %
Platelets: 318 10*3/uL (ref 150–400)
RBC: 4.13 MIL/uL (ref 3.87–5.11)
RDW: 13.2 % (ref 11.5–15.5)
WBC: 9.5 10*3/uL (ref 4.0–10.5)
nRBC: 0 % (ref 0.0–0.2)

## 2023-10-24 LAB — MAGNESIUM: Magnesium: 2.1 mg/dL (ref 1.7–2.4)

## 2023-10-24 LAB — HCG, SERUM, QUALITATIVE: Preg, Serum: NEGATIVE

## 2023-10-24 MED ORDER — LACTATED RINGERS IV BOLUS
1000.0000 mL | Freq: Once | INTRAVENOUS | Status: AC
  Administered 2023-10-24: 1000 mL via INTRAVENOUS

## 2023-10-24 NOTE — Discharge Instructions (Signed)
-   According to [place] law, you can not drive unless you are seizure / syncope free for at least 6 months and under physician's care.  °  °- Please maintain precautions. Do not participate in activities where a loss of awareness could harm you or someone else. No swimming alone, no tub bathing, no hot tubs, no driving, no operating motorized vehicles (cars, ATVs, motocycles, etc), lawnmowers, power tools or firearms. No standing at heights, such as rooftops, ladders or stairs. Avoid hot objects such as stoves, heaters, open fires. Wear a helmet when riding a bicycle, scooter, skateboard, etc. and avoid areas of traffic. Set your water heater to 120 degrees or less.  °

## 2023-10-24 NOTE — ED Triage Notes (Signed)
 Pt to ER via EMS from work where she was found by a customer.  Pt had possible unwitnessed seizure.  Pt was post ictal on EMS arrival and did bite her tongue.  Pt denies hx of seizures.  Pt has no memory of events.

## 2023-10-24 NOTE — ED Provider Notes (Signed)
 [place] EMERGENCY DEPARTMENT AT [facility] Provider Note   CSN: [id number] Arrival date & time: 10/24/23  1629     Patient presents with: Seizures   Megan Wiley is a 33 y.o. female.   HPI 33 year old female presents with possible seizure.  Patient was at work and remembers being in the bathroom and then felt lightheaded on the toilet.  Apparently she left the stall and washed her hands but does not remember any of that.  The next thing she remembers is being in the ambulance.  She was found by coworker and EMS reported that she seemed postictal.  She did bite her tongue.  Patient does not feel lightheaded anymore but does have a headache after waking up.  No prior history of seizures.  No preceding headache.  Has not felt sick recently.  She drinks alcohol occasionally but not in the last couple weeks.  She is on Lexapro for depression and has previously taken Xanax  but has not taken any Xanax  in over a month, maybe 2.  No recent illness or fevers.  Prior to Admission medications   Medication Sig Start Date End Date Taking? Authorizing Provider  acidophilus (RISAQUAD) CAPS capsule Take 1 capsule by mouth daily.    [provider]  ALPRAZolam  (XANAX ) 0.5 MG tablet Take 0.5 mg by mouth 2 (two) times daily as needed for anxiety.    [provider]  cetirizine  (ZYRTEC  ALLERGY) 10 MG tablet Take 1 tablet (10 mg total) by mouth daily. 06/21/20   Avegno, Komlanvi S, FNP  norgestimate -ethinyl estradiol  (SPRINTEC 28) 0.25-35 MG-MCG tablet Take 1 tablet by mouth daily. 10/15/19   Yevette Hem, FNP    Allergies: Patient has no known allergies.    Review of Systems  Constitutional:  Negative for fever.  Eyes:  Negative for visual disturbance.  Respiratory:  Negative for cough and shortness of breath.   Cardiovascular:  Negative for chest pain.  Gastrointestinal:  Negative for abdominal pain and vomiting.  Neurological:  Positive for syncope, light-headedness  and headaches. Negative for weakness and numbness.    Updated Vital Signs BP 122/72 (BP Location: Left Arm)   Pulse 78   Temp 98 F (36.7 C) (Oral)   Resp 18   Ht 5' 3 (1.6 m)   Wt 88.5 kg   LMP  (Within Weeks)   SpO2 99%   BMI 34.54 kg/m   Physical Exam Vitals and nursing note reviewed.  Constitutional:      Appearance: She is well-developed.  HENT:     Head: Normocephalic.     Mouth/Throat:     Comments: Small tongue abrasion/contusion to left distal tongue  Eyes:     Extraocular Movements: Extraocular movements intact.    Cardiovascular:     Rate and Rhythm: Normal rate and regular rhythm.     Heart sounds: Normal heart sounds.  Pulmonary:     Effort: Pulmonary effort is normal.     Breath sounds: Normal breath sounds.  Abdominal:     Palpations: Abdomen is soft.     Tenderness: There is no abdominal tenderness.   Musculoskeletal:     Cervical back: Normal range of motion. No rigidity.   Skin:    General: Skin is warm and dry.   Neurological:     Mental Status: She is alert.     Comments: CN 3-12 grossly intact. 5/5 strength in all 4 extremities. Grossly normal sensation. Normal finger to nose.     (all labs  ordered are listed, but only abnormal results are displayed) Labs Reviewed  COMPREHENSIVE METABOLIC PANEL WITH GFR - Abnormal; Notable for the following components:      Result Value   Glucose, Bld 114 (*)    All other components within normal limits  CBC WITH DIFFERENTIAL/PLATELET - Abnormal; Notable for the following components:   MCV 100.5 (*)    All other components within normal limits  HCG, SERUM, QUALITATIVE  MAGNESIUM  RAPID URINE DRUG SCREEN, HOSP PERFORMED    EKG: EKG Interpretation Date/Time:  Friday October 24 2023 18:09:56 EDT Ventricular Rate:  77 PR Interval:  106 QRS Duration:  80 QT Interval:  390 QTC Calculation: 441 R Axis:   49  Text Interpretation: Sinus rhythm with short PR  no acute ST/T changes No previous ECGs  available Confirmed by Jerilynn Montenegro [phone]) on 10/24/2023 6:16:32 PM  Radiology: CT HEAD WO CONTRAST Result Date: 10/24/2023 CLINICAL DATA:  Seizure, new-onset, no history of trauma EXAM: CT HEAD WITHOUT CONTRAST TECHNIQUE: Contiguous axial images were obtained from the base of the skull through the vertex without intravenous contrast. RADIATION DOSE REDUCTION: This exam was performed according to the departmental dose-optimization program which includes automated exposure control, adjustment of the mA and/or kV according to patient size and/or use of iterative reconstruction technique. COMPARISON:  None Available. FINDINGS: Brain: No evidence of acute infarction, hemorrhage, hydrocephalus, extra-axial collection or mass lesion/mass effect. Vascular: No hyperdense vessel. Skull: No acute fracture. Sinuses/Orbits: Clear sinuses.  No acute orbital findings. Other: No mastoid effusions. IMPRESSION: No evidence of acute intracranial abnormality. Electronically Signed   By: Stevenson Elbe M.D.   On: 10/24/2023 18:00     Procedures   Medications Ordered in the ED  lactated ringers  bolus 1,000 mL (0 mLs Intravenous Stopped 10/24/23 1931)                                    Medical Decision Making Amount and/or Complexity of Data Reviewed Labs: ordered.    Details: No significant electrolyte disturbance Radiology: ordered and independent interpretation performed.    Details: No head bleed ECG/medicine tests: ordered and independent interpretation performed.    Details: No significant arrhythmia   Based on history it sounds like the patient probably had a seizure.  Sound like she was postictal and has a mild tongue injury.  Low suspicion for infection.  She has completely returned back to normal and so while there might have been a seizure, this would be a first-time episode and she is well-appearing and I think stable for outpatient follow-up with neurology.  Will recommend Tylenol  for her  headache.  Otherwise, she was instructed not to drive until cleared by neurology.  Will discharge home with return precautions.  Does not sound like a withdrawal seizure from either benzos or alcohol based on history.     Final diagnoses:  Seizure [facility])    ED Discharge Orders          Ordered    Ambulatory referral to Neurology       Comments: An appointment is requested in approximately: 2 weeks   10/24/23 1905               Jerilynn Montenegro, MD 10/24/23 [phone]

## 2023-10-27 ENCOUNTER — Encounter: Payer: Self-pay | Admitting: Neurology

## 2023-11-26 ENCOUNTER — Encounter: Payer: Self-pay | Admitting: Neurology

## 2023-11-26 ENCOUNTER — Ambulatory Visit: Admitting: Neurology

## 2023-11-26 VITALS — BP 111/81 | HR 98 | Ht 63.0 in | Wt 194.0 lb

## 2023-11-26 DIAGNOSIS — R404 Transient alteration of awareness: Secondary | ICD-10-CM

## 2023-11-26 NOTE — Patient Instructions (Signed)
 Good to meet you.   Schedule open MRI brain with and without contrast  2. Schedule 1-hour EEG  3. Follow-up in 3 months, call for any changes   Seizure Precautions: 1. If medication has been prescribed for you to prevent seizures, take it exactly as directed.  Do not stop taking the medicine without talking to your doctor first, even if you have not had a seizure in a long time.   2. Avoid activities in which a seizure would cause danger to yourself or to others.  Don't operate dangerous machinery, swim alone, or climb in high or dangerous places, such as on ladders, roofs, or girders.  Do not drive unless your doctor says you may.  3. If you have any warning that you may have a seizure, lay down in a safe place where you can't hurt yourself.    4.  No driving for 6 months from last seizure, as per [place]  state law.   Please refer to the following link on the [employer]ile[employer] of [employer]'s website for more information: http://www.epilepsyfoundation.org/answerplace/Social/driving/drivingu.cfm   5.  Maintain good sleep hygiene. Avoid alcohol.  6.  Notify your neurology if you are planning pregnancy or if you become pregnant.  7.  Contact your doctor if you have any problems that may be related to the medicine you are taking.  8.  Call 911 and bring the patient back to the ED if:        A.  The seizure lasts longer than 5 minutes.       B.  The patient doesn't awaken shortly after the seizure  C.  The patient has new problems such as difficulty seeing, speaking or moving  D.  The patient was injured during the seizure  E.  The patient has a temperature over 102 F (39C)  F.  The patient vomited and now is having trouble breathing

## 2023-11-26 NOTE — Progress Notes (Signed)
 NEUROLOGY CONSULTATION NOTE  Megan Wiley MRN: [id number] DOB: 1990-08-31  Referring provider: Dr. Glendia Breeding (ER) Primary care provider: Ronal Leeroy Marie, FNP  Reason for consult:  possible seizure  Dear Dr Breeding:  Thank you for your kind referral of Megan Wiley for consultation of the above symptoms. Although her history is well known to you, please allow me to reiterate it for the purpose of our medical record. The patient was accompanied to the clinic by her mother who also provides collateral information. Records and images were personally reviewed where available.   HISTORY OF PRESENT ILLNESS: This is a very pleasant 33 year old right-handed woman with a history of anxiety, depression, presenting for evaluation of possible seizure that occurred on 10/24/2023. She reports that 1-2 months prior, Xanax  was reduced from TID dosing to once a day. She ran out of medication a week prior to the episode. For the 2 weeks leading up to it, she was under a lot more stress than usual, she was not eating much, sleep was okay but not as good as usual. The night prior, she was visiting her neighbor when she felt hot with the room spinning. She felt like she would pass out, she lay down with her eyes closed for 30-40 minutes and the symptoms resolved. She woke up fine the next day, went to work and had eaten that day. She was almost at the end of her shift, she was in the bathroom stall and had finished urinating. She recalls taking her phone out to text her mother when the ceiling light reflected on her phone and when the light hit her eye, she suddenly felt lightheaded like she would pass out. She has not recollection of events until she was in the ER. The person in the next stall heard her fall forward and hit her head on the door. They looked under and saw her lying on her side on the floor, they alerted another coworker who had to kick in the door. They found her laying on her side halfway  under the door with hands barely shaking, no convulsive activity. Her eyes were wide open with an extremely blank stare for 5-10 minutes. They rolled her on her back and her eyes closed, then as she was coming to, she was punching the air with both hands like fighting for her life. She had gotten up on her knees and pulled her pants up, walked to the chair, where EMS found her still with a blank stare and not responding, she kept saying help repeatedly. It took her 45-60 minutes to return to baseline. She bit the tip and side of her tongue, and there was urine on the side of her leg. Both calves were very weak, like tensed muscles. She had a bad headache for 2 weeks that has not recurred. In the ER, CBC, CMP were normal. EKG showed sinus rhythm with short PR. I personally reviewed head CT without contrast, no acute changes.  No prior history of similar symptoms. They deny any staring/unresponsive episodes. She denies any gaps in time, olfactory/gustatory hallucinations, deja vu, rising epigastric sensation, focal numbness/tingling/weakness, myoclonic jerks. She denies any history of headaches, none since June. No dizziness, diplopia, dysarthria/dysphagia, significant neck/back pain, bowel/bladder dysfunction. She has occasional diarrhea after eating, none that day. She has occasional palpitations when stressed out, she takes deep breaths and it improves. She usually gets 8 hours of sleep and naps in the afternoon. Mood is good. She occasionally drinks alcohol, none  prior to the episode. She lives with her 2 daughters ages 42 and 61yo. 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 Her father had alcohol-induced seizures at the end of his life. She had a normal birth and early development.  There is no history of febrile convulsions, CNS infections such as meningitis/encephalitis, significant traumatic brain injury, neurosurgical procedures   PAST MEDICAL HISTORY: Past Medical History:  Diagnosis Date    Anxiety    Depression     PAST SURGICAL HISTORY: Past Surgical History:  Procedure Laterality Date   CERVICAL CONIZATION W/BX N/A 05/27/2023   Procedure: CONIZATION CERVIX WITH BIOPSY, ENDOCERVICAL CURETTAGE;  Surgeon: Eldonna Mays, MD;  Location: [facility];  Service: Gynecology;  Laterality: N/A;   CESAREAN SECTION     TONSILLECTOMY      MEDICATIONS: Current Outpatient Medications on File Prior to Visit  Medication Sig Dispense Refill   acidophilus (RISAQUAD) CAPS capsule Take 1 capsule by mouth daily.     ALPRAZolam  (XANAX ) 0.5 MG tablet Take 0.5 mg by mouth 2 (two) times daily as needed for anxiety. (Patient taking differently: Take 0.5 mg by mouth 1 day or 1 dose.)     cetirizine  (ZYRTEC  ALLERGY) 10 MG tablet Take 1 tablet (10 mg total) by mouth daily. 30 tablet 0   citalopram  (CELEXA ) 20 MG tablet Take 20 mg by mouth daily.     norgestimate -ethinyl estradiol  (SPRINTEC 28) 0.25-35 MG-MCG tablet Take 1 tablet by mouth daily. 90 tablet 4   No current facility-administered medications on file prior to visit.    ALLERGIES: No Known Allergies  FAMILY HISTORY: Family History  Problem Relation Age of Onset   Hepatitis C Father    Cirrhosis Father    Diabetes Other    Hypertension Other    Colon cancer Neg Hx    Breast cancer Neg Hx    Ovarian cancer Neg Hx    Endometrial cancer Neg Hx    Pancreatic cancer Neg Hx    Prostate cancer Neg Hx     SOCIAL HISTORY: Social History   Socioeconomic History   Marital status: Single    Spouse name: Not on file   Number of children: Not on file   Years of education: Not on file   Highest education level: Not on file  Occupational History   Not on file  Tobacco Use   Smoking status: Never   Smokeless tobacco: Never  Vaping Use   Vaping status: Some Days  Substance and Sexual Activity   Alcohol use: Yes    Comment: occ   Drug use: Yes    Types: Marijuana    Comment: once every few months   Sexual activity: Yes     Partners: Male    Birth control/protection: Pill  Other Topics Concern   Not on file  Social History Narrative   Are you right handed or left handed? Right   Are you currently employed ?    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   Who lives with you? daughters   What type of home do you live in: 1 story or 2 story? one    Caffeine occ   Social Drivers of Health   Financial Resource Strain: Not on file  Food Insecurity: No Food Insecurity (04/18/2023)   Hunger Vital Sign    Worried About Running Out of Food in the Last Year: Never true    Ran Out of Food in the  Last Year: Never true  Transportation Needs: No Transportation Needs (04/18/2023)   PRAPARE - Administrator, Civil Service (Medical): No    Lack of Transportation (Non-Medical): No  Physical Activity: Not on file  Stress: Not on file  Social Connections: Not on file  Intimate Partner Violence: Not At Risk (04/18/2023)   Humiliation, Afraid, Rape, and Kick questionnaire    Fear of Current or Ex-Partner: No    Emotionally Abused: No    Physically Abused: No    Sexually Abused: No     PHYSICAL EXAM: Vitals:   11/26/23 1235  BP: 111/81  Pulse: 98  SpO2: 98%   General: No acute distress Head:  Normocephalic/atraumatic Skin/Extremities: No rash, no edema Neurological Exam: Mental status: alert and oriented to person, place, and time, no dysarthria or aphasia, Fund of knowledge is appropriate.  Recent and remote memory are intact, 3/3 delayed recall.  Attention and concentration are normal, 5/5 WORLD backwards.  Cranial nerves: CN I: not tested CN II: pupils equal, round, visual fields intact CN III, IV, VI:  full range of motion, no nystagmus, no ptosis CN V: facial sensation intact CN VII: upper and lower face symmetric CN VIII: hearing intact to conversation Bulk & Tone: normal, no fasciculations. Motor: 5/5 throughout with no pronator drift. Sensation: intact  to light touch, cold, pin, vibration sense.  No extinction to double simultaneous stimulation.  Romberg test negative Deep Tendon Reflexes: brisk +2 throughout, no ankle clonus, negative Hoffman sign Plantar responses: downgoing bilaterally Cerebellar: no incoordination on finger to nose testing Gait: narrow-based and steady, able to tandem walk adequately. Tremor: none  IMPRESSION: This is a very pleasant 33 year old right-handed woman with a history of anxiety, depression, presenting for evaluation of possible seizure that occurred on 10/24/2023. She had an episode of loss of consciousness on the commode with staring/unresponsiveness witnessed after the fall. She reports an episode of feeling lightheaded the day prior and just before the episode. Etiology of symptoms unclear, seizure versus syncope. From a neurological standpoint, open MRI brain with and without contrast (patient claustrophobic) and 1-hour EEG will be ordered. We discussed that after an initial seizure, unless there are significant risk factors, an abnormal neurological exam, an EEG showing epileptiform abnormalities, and/or abnormal neuroimaging, treatment with an antiepileptic drug is not indicated. We discussed 10% of the population may have a single seizure. Patients with a single unprovoked seizure have a recurrence rate of 33% after a single seizure and 73% after a second seizure. We discussed [place] driving restrictions which indicate a patient needs to free of seizures or events of altered awareness for 6 months prior to resuming driving. Follow-up in 3 months, they know to call for any changes.    Thank you for allowing me to participate in the care of this patient. Please do not hesitate to call for any questions or concerns.   Darice Shivers, M.D.  CC: Dr. Freddi, Ronal Leeroy Marie, FNP

## 2023-11-28 ENCOUNTER — Ambulatory Visit: Admitting: Neurology

## 2023-11-28 DIAGNOSIS — R404 Transient alteration of awareness: Secondary | ICD-10-CM

## 2023-11-28 NOTE — Progress Notes (Signed)
 EEG complete and ready for review.

## 2023-12-01 NOTE — Procedures (Signed)
 ELECTROENCEPHALOGRAM REPORT  Date of Study: 11/28/2023  Patient's Name: Megan Wiley MRN: [id number] Date of Birth: 02/20/1991  Referring Provider: Dr. Darice Shivers  Clinical History: This is a 33 year old woman with an episode of loss of consciousness on the commode with staring/unresponsiveness witnessed after the fall. EEG for classification  CNS Active Medications: Celexa , Xanax   Technical Summary: A multichannel digital 1-hour EEG recording measured by the international 10-20 system with electrodes applied with paste and impedances below 5000 ohms performed in our laboratory with EKG monitoring in an awake and asleep patient.  Hyperventilation and photic stimulation were performed.  The digital EEG was referentially recorded, reformatted, and digitally filtered in a variety of bipolar and referential montages for optimal display.    Description: The patient is awake and asleep during the recording.  During maximal wakefulness, there is a symmetric, medium voltage 9-10 Hz posterior dominant rhythm that attenuates with eye opening.  The record is symmetric.  There is an excess amount of diffuse low voltage beta activity seen throughout the recording. During drowsiness and sleep, there is an increase in theta slowing of the background, at times sharply contoured over the bilateral temporal regions consistent with wicket spikes, a normal variant with no pathological significance. Vertex waves and symmetric sleep spindles were seen.  Hyperventilation and photic stimulation did not elicit any abnormalities.  There were no epileptiform discharges or electrographic seizures seen.    EKG lead was unremarkable.  Impression: This 1-hour awake and asleep EEG is normal except for excess amount of diffuse low voltage beta activity.  Clinical Correlation: Diffuse low voltage beta activity is commonly seen with sedating medications such as benzodiazepines.  In the absence of sedating medications,  anxiety and hyperthyroidism may produce generalized beta activity.  The absence of epileptiform discharges does not exclude a clinical diagnosis of epilepsy.  If further clinical questions remain, prolonged EEG may be helpful.  Clinical correlation is advised.   Darice Shivers, M.D.

## 2023-12-02 ENCOUNTER — Ambulatory Visit: Payer: Self-pay | Admitting: Neurology

## 2023-12-02 ENCOUNTER — Telehealth: Payer: Self-pay | Admitting: Neurology

## 2023-12-02 NOTE — Telephone Encounter (Signed)
 Results given to patient.    Referral for MRI was waiting on insurance card. Patient will send a copy through mychart.    Patient wants [facility].

## 2023-12-02 NOTE — Telephone Encounter (Signed)
 Pt wants someone to call and give her the results of her EEG

## 2023-12-02 NOTE — Telephone Encounter (Signed)
 See results note.

## 2023-12-03 NOTE — Progress Notes (Signed)
 Copy imaging, last OV, and insurance (MRI) faxed to [facility].

## 2023-12-10 ENCOUNTER — Telehealth: Payer: Self-pay | Admitting: Neurology

## 2023-12-10 NOTE — Telephone Encounter (Signed)
 Re-faxed completed MRI referral w/ signature-[facility]-[phone].

## 2023-12-10 NOTE — Telephone Encounter (Signed)
 Megan Wiley with [facility] at [phone], stated that the referral MRI is missing the doctor's signature. They need the document faxed to them with signature and the fax number is [phone]

## 2023-12-10 NOTE — Telephone Encounter (Signed)
 NO PA for appt 12/11/23 needs PA for radiology order ASAP WEP:[id number] CPT Rniz:[id number]

## 2023-12-10 NOTE — Telephone Encounter (Signed)
Forward message.

## 2023-12-11 NOTE — Progress Notes (Signed)
 Prior Authorization approved. [employer]. Authorization #:[id number]. Valid from: 12/11/23-02/08/24.

## 2023-12-12 NOTE — Telephone Encounter (Signed)
 Completed. See Documentation from 12/12/23

## 2023-12-30 ENCOUNTER — Telehealth: Payer: Self-pay | Admitting: Neurology

## 2023-12-30 NOTE — Telephone Encounter (Signed)
 MRI results scanned into chart please review.   Patient advised once reviewed we give her a call.

## 2023-12-30 NOTE — Telephone Encounter (Signed)
 Patient would like to get the results of the MRI please call

## 2023-12-31 NOTE — Telephone Encounter (Signed)
 Patient advised.

## 2023-12-31 NOTE — Telephone Encounter (Signed)
 Pls let her know brain MRI looks good, no tumor, stroke, or bleed. There was note of a little fluid in the middle ear area, if she is feeling congested, pls have her f/u with PCP for it. Thanks

## 2024-02-23 ENCOUNTER — Ambulatory Visit: Admitting: Neurology

## 2024-02-23 ENCOUNTER — Encounter: Payer: Self-pay | Admitting: Neurology
